# Patient Record
Sex: Female | Born: 1960
Health system: Southern US, Community
[De-identification: ages and names within clinical notes are randomized; demographics above are authoritative.]

---

## 2017-06-24 ENCOUNTER — Ambulatory Visit: Payer: Self-pay | Admitting: Primary Care

## 2017-07-15 ENCOUNTER — Ambulatory Visit (INDEPENDENT_AMBULATORY_CARE_PROVIDER_SITE_OTHER): Payer: BLUE CROSS/BLUE SHIELD | Admitting: Primary Care

## 2017-07-15 ENCOUNTER — Other Ambulatory Visit (HOSPITAL_COMMUNITY)
Admission: RE | Admit: 2017-07-15 | Discharge: 2017-07-15 | Disposition: A | Discharge status: Home / Self Care | Payer: BLUE CROSS/BLUE SHIELD | Source: Ambulatory Visit | Attending: Primary Care | Admitting: Primary Care

## 2017-07-15 ENCOUNTER — Encounter: Payer: Self-pay | Admitting: Primary Care

## 2017-07-15 VITALS — BP 128/80 | HR 102 | Temp 98.1°F | Ht 65.0 in | Wt 127.8 lb

## 2017-07-15 DIAGNOSIS — Z124 Encounter for screening for malignant neoplasm of cervix: Secondary | ICD-10-CM | POA: Insufficient documentation

## 2017-07-15 DIAGNOSIS — Z0000 Encounter for general adult medical examination without abnormal findings: Secondary | ICD-10-CM | POA: Diagnosis not present

## 2017-07-15 DIAGNOSIS — Z1211 Encounter for screening for malignant neoplasm of colon: Secondary | ICD-10-CM | POA: Diagnosis not present

## 2017-07-15 DIAGNOSIS — Z1239 Encounter for other screening for malignant neoplasm of breast: Secondary | ICD-10-CM

## 2017-07-15 DIAGNOSIS — Z23 Encounter for immunization: Secondary | ICD-10-CM

## 2017-07-15 DIAGNOSIS — Z1231 Encounter for screening mammogram for malignant neoplasm of breast: Secondary | ICD-10-CM

## 2017-07-15 DIAGNOSIS — Z0001 Encounter for general adult medical examination with abnormal findings: Secondary | ICD-10-CM | POA: Insufficient documentation

## 2017-07-15 LAB — LIPID PANEL
Cholesterol: 232 mg/dL — ABNORMAL HIGH (ref 0–200)
HDL: 111.1 mg/dL (ref >39.00)
LDL Cholesterol: 103 mg/dL — ABNORMAL HIGH (ref 0–99)
NonHDL: 120.54
Total CHOL/HDL Ratio: 2
Triglycerides: 89 mg/dL (ref 0.0–149.0)
VLDL: 17.8 mg/dL (ref 0.0–40.0)

## 2017-07-15 LAB — COMPREHENSIVE METABOLIC PANEL
ALT: 18 U/L (ref 0–35)
AST: 23 U/L (ref 0–37)
Albumin: 4.6 g/dL (ref 3.5–5.2)
Alkaline Phosphatase: 39 U/L (ref 39–117)
BUN: 17 mg/dL (ref 6–23)
CO2: 28 mEq/L (ref 19–32)
Calcium: 10 mg/dL (ref 8.4–10.5)
Chloride: 95 mEq/L — ABNORMAL LOW (ref 96–112)
Creatinine, Ser: 0.62 mg/dL (ref 0.40–1.20)
GFR: 105.53 mL/min (ref >60.00)
Glucose, Bld: 92 mg/dL (ref 70–99)
Potassium: 3.6 mEq/L (ref 3.5–5.1)
Sodium: 135 mEq/L (ref 135–145)
Total Bilirubin: 1.4 mg/dL — ABNORMAL HIGH (ref 0.2–1.2)
Total Protein: 7.7 g/dL (ref 6.0–8.3)

## 2017-07-15 NOTE — Addendum Note (Signed)
Addended by: Tawnya Crook on: 07/15/2017 10:57 AM   Modules accepted: Orders

## 2017-07-15 NOTE — Progress Notes (Signed)
Subjective:    Patient ID: Tracy Nash, female    DOB: Apr 17, 1961, 56 y.o.   MRN: [id number]  HPI  [name] is a 56 year old female who presents today to establish care and for complete physical. Her last physical was years ago.   Immunizations: -Tetanus: Completed over 10 years.  -Influenza: Due this season.    Diet: She endorses a poor diet.  Breakfast: Skips, sometimes fruit Lunch: Skips sometimes, sandwich Dinner: Fish, chicken, veggies Snacks: Crackers, fruit, chips Desserts: None Beverages: Coffee, sweet tea, water  Exercise: She works out at [employer] 4 days weekly Eye exam: Completed years ago. Due.  Dental exam: Dentures. Has an appointment in 2 weeks.  Colonoscopy: Never completed.  Pap Smear: Completed years ago.  Mammogram: Never completed.      Review of Systems  Constitutional: Negative for unexpected weight change.  HENT: Negative for rhinorrhea.   Respiratory: Negative for cough and shortness of breath.   Cardiovascular: Negative for chest pain.  Gastrointestinal: Negative for constipation and diarrhea.  Genitourinary: Negative for difficulty urinating and menstrual problem.  Musculoskeletal: Negative for arthralgias and myalgias.  Skin: Negative for rash.  Allergic/Immunologic: Negative for environmental allergies.  Neurological: Negative for dizziness, numbness and headaches.  Psychiatric/Behavioral:       Denies concerns for anxiety and depression       No past medical history on file.   Social History   Social History  . Marital status: Widowed    Spouse name: N/A  . Number of children: N/A  . Years of education: N/A   Occupational History  . Not on file.   Social History Main Topics  . Smoking status: Current Every Day Smoker    Packs/day: 0.50  . Smokeless tobacco: Never Used  . Alcohol use Yes  . Drug use: Unknown  . Sexual activity: Not on file   Other Topics Concern  . Not on file   Social History Narrative  . No  narrative on file    No past surgical history on file.  Family History  Problem Relation Age of Onset  . Cancer Mother   . Hypertension Mother     No Known Allergies  No current outpatient prescriptions on file prior to visit.   No current facility-administered medications on file prior to visit.     BP 128/80   Pulse (!) 102   Temp 98.1 F (36.7 C) (Oral)   Ht 5\' 5"  (1.651 m)   Wt 127 lb 12.8 oz (58 kg)   SpO2 98%   BMI 21.27 kg/m    Objective:   Physical Exam  Constitutional: She is oriented to person, place, and time. She appears well-nourished.  HENT:  Right Ear: Tympanic membrane and ear canal normal.  Left Ear: Tympanic membrane and ear canal normal.  Nose: Nose normal.  Mouth/Throat: Oropharynx is clear and moist.  Eyes: Pupils are equal, round, and reactive to light. Conjunctivae and EOM are normal.  Neck: Neck supple. No thyromegaly present.  Cardiovascular: Normal rate and regular rhythm.   No murmur heard. Pulmonary/Chest: Effort normal and breath sounds normal. She has no rales.  Abdominal: Soft. Bowel sounds are normal. There is no tenderness.  Musculoskeletal: Normal range of motion.  Lymphadenopathy:    She has no cervical adenopathy.  Neurological: She is alert and oriented to person, place, and time. She has normal reflexes. No cranial nerve deficit.  Skin: Skin is warm and dry. No rash noted.  Psychiatric: She  has a normal mood and affect.          Assessment & Plan:

## 2017-07-15 NOTE — Patient Instructions (Signed)
Complete lab work prior to leaving today. I will notify you of your results once received.   Call the [facility] to schedule your mammogram.  You will be contacted regarding your referral to GI for the colonoscopy.  Please let us know if you have not heard back within one week.   You were provided with a tetanus vaccination which will cover you for 10 years.   We will notify you once we receive your Pap results.  Follow up in 1 year for your annual exam or sooner if needed.  It was a pleasure to meet you today! Please don't hesitate to call me with any questions. Welcome to [facility]!

## 2017-07-15 NOTE — Assessment & Plan Note (Signed)
Td due, provided today. Pap smear due, completed today and is pending. Mammogram due, ordered. Colonoscopy due, referral placed. Recommended to increase vegetables, fruit, whole grains. Commended her on regular exercise. Exam unremarkable. Labs pending. Follow up in 1 year for annual exam.

## 2017-07-16 ENCOUNTER — Encounter: Payer: Self-pay | Admitting: *Deleted

## 2017-07-17 ENCOUNTER — Encounter: Payer: Self-pay | Admitting: *Deleted

## 2017-07-17 LAB — CYTOLOGY - PAP
Diagnosis: NEGATIVE
HPV: NOT DETECTED

## 2017-07-29 ENCOUNTER — Ambulatory Visit
Admission: RE | Admit: 2017-07-29 | Discharge: 2017-07-29 | Disposition: A | Discharge status: Home / Self Care | Payer: BLUE CROSS/BLUE SHIELD | Source: Ambulatory Visit | Attending: Primary Care | Admitting: Primary Care

## 2017-07-29 DIAGNOSIS — Z1239 Encounter for other screening for malignant neoplasm of breast: Secondary | ICD-10-CM

## 2017-07-29 DIAGNOSIS — Z1231 Encounter for screening mammogram for malignant neoplasm of breast: Secondary | ICD-10-CM | POA: Diagnosis not present

## 2017-07-31 ENCOUNTER — Ambulatory Visit (INDEPENDENT_AMBULATORY_CARE_PROVIDER_SITE_OTHER): Payer: BLUE CROSS/BLUE SHIELD | Admitting: Primary Care

## 2017-07-31 ENCOUNTER — Encounter: Payer: Self-pay | Admitting: Primary Care

## 2017-07-31 VITALS — BP 126/78 | HR 74 | Temp 98.4°F | Wt 129.8 lb

## 2017-07-31 DIAGNOSIS — Z0289 Encounter for other administrative examinations: Secondary | ICD-10-CM

## 2017-07-31 NOTE — Progress Notes (Signed)
   Subjective:    Patient ID: Tracy Nash, female    DOB: 06/16/1961, 56 y.o.   MRN: [id number]  HPI  Tracy Nash is a 56 year old female who presents today for form completion. She had DWI in [place] in 2016 after the death of her son. Her license was suspended at that time, she wasn't aware. She went to apply for her [place] drivers license and was told she needed medical evaluation and also needed a breath-a-lizer device placed in the vehicle. The [place] DMV is requiring form completion in order to reinstate her licence.   She denies any alcohol or substance abuse, chest pain, psychological issues, respiratory disorders, numbness/tingling/weakness, difficulty with sleep. She under went CPE on 07/15/17.  Review of Systems  Constitutional: Negative for unexpected weight change.  HENT: Negative for rhinorrhea.   Respiratory: Negative for cough and shortness of breath.   Cardiovascular: Negative for chest pain.  Gastrointestinal: Negative for constipation and diarrhea.  Genitourinary: Negative for difficulty urinating and menstrual problem.  Musculoskeletal: Negative for arthralgias and myalgias.  Skin: Negative for rash.  Allergic/Immunologic: Negative for environmental allergies.  Neurological: Negative for dizziness, numbness and headaches.  Psychiatric/Behavioral: Negative for sleep disturbance. The patient is not nervous/anxious.        No past medical history on file.   Social History   Social History  . Marital status: Widowed    Spouse name: N/A  . Number of children: N/A  . Years of education: N/A   Occupational History  . Not on file.   Social History Main Topics  . Smoking status: Current Every Day Smoker    Packs/day: 0.50  . Smokeless tobacco: Never Used  . Alcohol use Yes  . Drug use: Unknown  . Sexual activity: Not on file   Other Topics Concern  . Not on file   Social History Narrative  . No narrative on file    No past surgical history on  file.  Family History  Problem Relation Age of Onset  . Cancer Mother   . Hypertension Mother   . Ovarian cancer Mother [id number]  . Breast cancer Paternal Grandmother     No Known Allergies  No current outpatient prescriptions on file prior to visit.   No current facility-administered medications on file prior to visit.     BP 126/78   Pulse 74   Temp 98.4 F (36.9 C) (Oral)   Wt 129 lb 12.8 oz (58.9 kg)   SpO2 98%   BMI 21.60 kg/m    Objective:   Physical Exam  Constitutional: She appears well-nourished.  Cardiovascular: Normal rate and regular rhythm.   Pulmonary/Chest: Effort normal and breath sounds normal.  Skin: Skin is warm and dry.  Psychiatric: She has a normal mood and affect.          Assessment & Plan:  Form Completion:  Needs form completion from DMV to reinstate her drivers license. Form completed. I do not see any reason why she could not drive. CPE on 07/15/17 unremarkable. Full exam completed that date.  Tracy Nash,Tracy Leedom Kendal, NP

## 2017-07-31 NOTE — Patient Instructions (Signed)
Please notify me if you require any additional information.   It was a pleasure to see you today!

## 2017-08-01 ENCOUNTER — Telehealth: Payer: Self-pay

## 2017-08-01 ENCOUNTER — Other Ambulatory Visit: Payer: Self-pay

## 2017-08-01 DIAGNOSIS — Z1211 Encounter for screening for malignant neoplasm of colon: Secondary | ICD-10-CM

## 2017-08-01 DIAGNOSIS — Z1212 Encounter for screening for malignant neoplasm of rectum: Principal | ICD-10-CM

## 2017-08-01 NOTE — Telephone Encounter (Signed)
Gastroenterology Pre-Procedure Review  Request Date: 10/8  Requesting Physician: Dr. Tobi BastosAnna  PATIENT REVIEW QUESTIONS: The patient responded to the following health history questions as indicated:    *No major illnesses at this time  1. Are you having any GI issues? no 2. Do you have a personal history of Polyps? no 3. Do you have a family history of Colon Cancer or Polyps? no 4. Diabetes Mellitus? no 5. Joint replacements in the past 12 months?no 6. Major health problems in the past 3 months?no 7. Any artificial heart valves, MVP, or defibrillator?no    MEDICATIONS & ALLERGIES:    Patient reports the following regarding taking any anticoagulation/antiplatelet therapy:   Plavix, Coumadin, Eliquis, Xarelto, Lovenox, Pradaxa, Brilinta, or Effient? no Aspirin? no  Patient confirms/reports the following medications:  No current outpatient prescriptions on file.   No current facility-administered medications for this visit.     Patient confirms/reports the following allergies:  No Known Allergies  No orders of the defined types were placed in this encounter.   AUTHORIZATION INFORMATION Primary Insurance: 1D#: Group #:  Secondary Insurance: 1D#: Group #:  SCHEDULE INFORMATION: Date: 10/8 Time: Location: [facility]

## 2017-08-12 ENCOUNTER — Telehealth: Payer: Self-pay | Admitting: Gastroenterology

## 2017-08-12 NOTE — Telephone Encounter (Signed)
Patient Tracy Nash to call her so she can r/s her procedure on 08/26/17.

## 2017-08-13 ENCOUNTER — Telehealth: Payer: Self-pay

## 2017-08-13 NOTE — Telephone Encounter (Signed)
LVM for patient return call. Responded to message received.

## 2017-08-26 ENCOUNTER — Ambulatory Visit
Admission: RE | Admit: 2017-08-26 | Payer: BLUE CROSS/BLUE SHIELD | Source: Ambulatory Visit | Admitting: Gastroenterology

## 2017-08-26 ENCOUNTER — Encounter: Admission: RE | Payer: Self-pay | Source: Ambulatory Visit

## 2017-08-26 SURGERY — COLONOSCOPY WITH PROPOFOL
Anesthesia: General

## 2017-08-28 ENCOUNTER — Telehealth: Payer: Self-pay | Admitting: *Deleted

## 2017-08-28 NOTE — Telephone Encounter (Signed)
Patient dropped off at form that needs that was not entirely completely by the provider Mayra Reel. Patient states it is the last question on the second page. The form will be in the nurse RX tower. If you have any questions you can contact the patient . Her contact number Is [phone].

## 2017-08-28 NOTE — Telephone Encounter (Signed)
Noted, form completed and placed in Chan's inbox. 

## 2017-08-28 NOTE — Telephone Encounter (Signed)
Placed paperwork in Tracy Nash's inbox for review 

## 2017-08-28 NOTE — Telephone Encounter (Signed)
Per DPR, left detail message of Kate's comments for patient. 

## 2017-10-08 DIAGNOSIS — F438 Other reactions to severe stress: Secondary | ICD-10-CM | POA: Diagnosis not present

## 2018-04-23 ENCOUNTER — Ambulatory Visit (INDEPENDENT_AMBULATORY_CARE_PROVIDER_SITE_OTHER): Payer: BLUE CROSS/BLUE SHIELD | Admitting: Primary Care

## 2018-04-23 ENCOUNTER — Encounter: Payer: Self-pay | Admitting: Primary Care

## 2018-04-23 VITALS — BP 126/80 | HR 80 | Temp 97.9°F | Ht 65.0 in | Wt 127.5 lb

## 2018-04-23 DIAGNOSIS — H6121 Impacted cerumen, right ear: Secondary | ICD-10-CM | POA: Diagnosis not present

## 2018-04-23 NOTE — Progress Notes (Signed)
Subjective:    Patient ID: Tracy Nash, female    DOB: 03/10/1961, 57 y.o.   MRN: [id number]  HPI  Tracy Nash is a 57 year old female who presents today with a chief complaint of ear fullness.  Her fullness is located to the right ear that she first noticed 2 weeks ago. She wears a headset at work and has noticed that the sounds are more muffled. She also endorses a ringing in the right ear which is constant. She swims nearly everyday in the pool and sometimes in the ocean. She's been using peroxide and OTC ear drops without improvement.   She denies congestion, rhinorrhea, sinus pressure, cough. She uses q-tips regularly.  Review of Systems  Constitutional: Negative for fever.  HENT: Negative for congestion, ear pain, sinus pressure and sore throat.        Ear fullness  Respiratory: Negative for cough.        No past medical history on file.   Social History   Socioeconomic History  . Marital status: Widowed    Spouse name: Not on file  . Number of children: Not on file  . Years of education: Not on file  . Highest education level: Not on file  Occupational History  . Not on file  Social Needs  . Financial resource strain: Not on file  . Food insecurity:    Worry: Not on file    Inability: Not on file  . Transportation needs:    Medical: Not on file    Non-medical: Not on file  Tobacco Use  . Smoking status: Current Some Day Smoker    Packs/day: 0.50  . Smokeless tobacco: Never Used  Substance and Sexual Activity  . Alcohol use: Yes  . Drug use: Not on file  . Sexual activity: Not on file  Lifestyle  . Physical activity:    Days per week: Not on file    Minutes per session: Not on file  . Stress: Not on file  Relationships  . Social connections:    Talks on phone: Not on file    Gets together: Not on file    Attends religious service: Not on file    Active member of club or organization: Not on file    Attends meetings of clubs or organizations: Not  on file    Relationship status: Not on file  . Intimate partner violence:    Fear of current or ex partner: Not on file    Emotionally abused: Not on file    Physically abused: Not on file    Forced sexual activity: Not on file  Other Topics Concern  . Not on file  Social History Narrative  . Not on file    No past surgical history on file.  Family History  Problem Relation Age of Onset  . Cancer Mother   . Hypertension Mother   . Ovarian cancer Mother [id number]  . Breast cancer Paternal Grandmother     No Known Allergies  No current outpatient medications on file prior to visit.   No current facility-administered medications on file prior to visit.     BP 126/80   Pulse 80   Temp 97.9 F (36.6 C) (Oral)   Ht 5\' 5"  (1.651 m)   Wt 127 lb 8 oz (57.8 kg)   BMI 21.22 kg/m    Objective:   Physical Exam  Constitutional: She appears well-nourished.  HENT:  Left Ear: Tympanic membrane and ear canal normal.  Nose: No mucosal edema.  Mouth/Throat: Oropharynx is clear and moist.  Right canal with moderate cerumen impaction without visualization of TM. TM irrigated, post irrigation without infection. Mild irritation of skin of canal  Neck: Neck supple.  Cardiovascular: Normal rate.  Respiratory: Effort normal and breath sounds normal.  Skin: Skin is warm and dry.           Assessment & Plan:  Cerumen Impaction:  Ear fullness x 2 weeks, also with ringing and muffled sound. Exam today with moderate cerumen impaction to right canal, left canal and TM unremarkable.  Right TM and canal post irrigation unremarkable. Mild irritation to skin of canal.  Discussed use of Debrox drops, avoid q tip use.   Doreene Nest, NP

## 2018-04-23 NOTE — Patient Instructions (Signed)
Consider using Debrox drops if you start to notice ear fullness again.  Consider mixing 50/50 rubbing alcohol and peroxide and use a few drops after each swim.  Avoid use of q-tips.   It was a pleasure to see you today!

## 2018-08-12 ENCOUNTER — Encounter (INDEPENDENT_AMBULATORY_CARE_PROVIDER_SITE_OTHER): Payer: Self-pay

## 2018-08-12 ENCOUNTER — Encounter: Payer: Self-pay | Admitting: Primary Care

## 2018-08-12 ENCOUNTER — Ambulatory Visit (INDEPENDENT_AMBULATORY_CARE_PROVIDER_SITE_OTHER): Payer: BLUE CROSS/BLUE SHIELD | Admitting: Primary Care

## 2018-08-12 ENCOUNTER — Ambulatory Visit (INDEPENDENT_AMBULATORY_CARE_PROVIDER_SITE_OTHER)
Admission: RE | Admit: 2018-08-12 | Discharge: 2018-08-12 | Disposition: A | Discharge status: Home / Self Care | Payer: BLUE CROSS/BLUE SHIELD | Source: Ambulatory Visit | Attending: Primary Care | Admitting: Primary Care

## 2018-08-12 ENCOUNTER — Encounter

## 2018-08-12 VITALS — BP 122/78 | HR 87 | Temp 98.3°F | Ht 65.0 in | Wt 135.5 lb

## 2018-08-12 DIAGNOSIS — G8929 Other chronic pain: Secondary | ICD-10-CM

## 2018-08-12 DIAGNOSIS — M25562 Pain in left knee: Secondary | ICD-10-CM

## 2018-08-12 DIAGNOSIS — M1712 Unilateral primary osteoarthritis, left knee: Secondary | ICD-10-CM | POA: Diagnosis not present

## 2018-08-12 MED ORDER — NAPROXEN 500 MG PO TABS: 500.0000 mg | ORAL_TABLET | Freq: Two times a day (BID) | ORAL | 0 refills | Status: DC

## 2018-08-12 NOTE — Assessment & Plan Note (Signed)
Located to left knee, intermittent. Acute on chronic presentation today. Start with plain films today. Rx for naproxen course sent to pharmacy. Discussed ice/heat, avoid vigorous exercise. Consider sports medicine vs PT once plain films return.

## 2018-08-12 NOTE — Progress Notes (Signed)
Subjective:    Patient ID: Tracy Nash, female    DOB: 09/11/1961, 57 y.o.   MRN: [id number]  HPI  Tracy Nash is a 57 year old female with a history of chronic knee pain who presents today with a chief complaint of knee pain.   Her pain is is located the left knee at the medial side which began one week ago. Her symptoms began with left knee swelling which was also apparent to the calf, ankle and toes, this dissipated within 24 hours. The swelling to the left knee has improved but not resolved. Her pain remains the same.   She works out at [employer] 3-4 times weekly, no changes in her physical activity. One week ago she started wearing a knee brace during the day due to pain. Overall her pain is about the same.   She denies weakness, numbness/tinlging, feeling that the knee is giving out, recent trauma. She's taken Tylenol recently for her pain.   Review of Systems  Musculoskeletal: Positive for joint swelling.       Left knee pain  Skin: Negative for color change.  Neurological: Negative for weakness and numbness.       No past medical history on file.   Social History   Socioeconomic History  . Marital status: Widowed    Spouse name: Not on file  . Number of children: Not on file  . Years of education: Not on file  . Highest education level: Not on file  Occupational History  . Not on file  Social Needs  . Financial resource strain: Not on file  . Food insecurity:    Worry: Not on file    Inability: Not on file  . Transportation needs:    Medical: Not on file    Non-medical: Not on file  Tobacco Use  . Smoking status: Current Some Day Smoker    Packs/day: 0.50  . Smokeless tobacco: Never Used  Substance and Sexual Activity  . Alcohol use: Yes  . Drug use: Not on file  . Sexual activity: Not on file  Lifestyle  . Physical activity:    Days per week: Not on file    Minutes per session: Not on file  . Stress: Not on file  Relationships  . Social  connections:    Talks on phone: Not on file    Gets together: Not on file    Attends religious service: Not on file    Active member of club or organization: Not on file    Attends meetings of clubs or organizations: Not on file    Relationship status: Not on file  . Intimate partner violence:    Fear of current or ex partner: Not on file    Emotionally abused: Not on file    Physically abused: Not on file    Forced sexual activity: Not on file  Other Topics Concern  . Not on file  Social History Narrative  . Not on file    No past surgical history on file.  Family History  Problem Relation Age of Onset  . Cancer Mother   . Hypertension Mother   . Ovarian cancer Mother [id number]  . Breast cancer Paternal Grandmother     No Known Allergies  No current outpatient medications on file prior to visit.   No current facility-administered medications on file prior to visit.     BP 122/78   Pulse 87   Temp 98.3 F (36.8 C) (Oral)  Ht 5\' 5"  (1.651 m)   Wt 135 lb 8 oz (61.5 kg)   SpO2 97%   BMI 22.55 kg/m    Objective:   Physical Exam  Constitutional: She appears well-nourished.  Cardiovascular: Normal rate.  Respiratory: Effort normal.  Musculoskeletal:       Legs: 5/5 strength to bilateral lower extremities. Ambulatory in clinic without difficulty. Mild swelling to left medial knee.   Skin: Skin is warm and dry. No erythema.           Assessment & Plan:

## 2018-08-12 NOTE — Patient Instructions (Signed)
Complete xray(s) prior to leaving today. I will notify you of your results once received.  You may take the naproxen twice daily with food as needed for pain and inflammation.  Continue to wear the knee brace during the day.  Avoid intense exercise for the rest of this week.  It was a pleasure to see you today!

## 2018-08-18 ENCOUNTER — Ambulatory Visit: Payer: BLUE CROSS/BLUE SHIELD | Admitting: Primary Care

## 2018-08-18 ENCOUNTER — Telehealth: Payer: Self-pay | Admitting: Primary Care

## 2018-08-18 NOTE — Telephone Encounter (Signed)
Copied from CRM [id number]. Topic: General - Other >> Aug 18, 2018  2:45 PM Maia Petties wrote: Reason for CRM: Pt called stating she is taking naproxen (NAPROSYN) 500 MG tablet and wearing a knee brace. She states it is not feeling any different with the naproxen. She isn't sure next steps. She mentioned talk of sports med or PT at the last OV. Pt does exercise and work out and does want to be able to continue. Pt states ok to leave detailed msg. Please advise.

## 2018-08-18 NOTE — Telephone Encounter (Signed)
Please notify patient that I'd like for her to see Dr. Patsy Lager. Will you schedule her for a visit with him?

## 2018-08-25 ENCOUNTER — Ambulatory Visit (INDEPENDENT_AMBULATORY_CARE_PROVIDER_SITE_OTHER): Payer: BLUE CROSS/BLUE SHIELD | Admitting: Family Medicine

## 2018-08-25 ENCOUNTER — Encounter: Payer: Self-pay | Admitting: Family Medicine

## 2018-08-25 VITALS — BP 100/70 | HR 104 | Temp 98.5°F | Ht 65.0 in | Wt 133.8 lb

## 2018-08-25 DIAGNOSIS — M2392 Unspecified internal derangement of left knee: Secondary | ICD-10-CM

## 2018-08-25 DIAGNOSIS — M1712 Unilateral primary osteoarthritis, left knee: Secondary | ICD-10-CM | POA: Diagnosis not present

## 2018-08-25 MED ORDER — METHYLPREDNISOLONE ACETATE 40 MG/ML IJ SUSP
80.0000 mg | Freq: Once | INTRAMUSCULAR | Status: AC
  Administered 2018-08-25: 80 mg via INTRA_ARTICULAR

## 2018-08-25 NOTE — Progress Notes (Signed)
Dr. Karleen Hampshire T. Legacy Carrender, MD, CAQ [facility] [address] [place], [address] Phone: [phone] Fax: [phone]  08/25/2018  Patient: Tracy Nash, MRN: [id number], DOB: Apr 05, 1961, 57 y.o.  Primary Physician:  Doreene Nest, NP   Chief Complaint  Patient presents with  . Knee Pain    Left   Subjective:   Tracy Nash is a 57 y.o. very pleasant female patient who presents with the following:  Patient presents for L knee pain eval and OA:  No injury. Has been working out for a long time.  Sitting all day for work.  Works out at [employer].   Has been taking some naprosyn. No help.  Medial joint line pain. Will click and pop.  No locking up, no giving way.  She has tried tylenol and naproxen.   Past Medical History, Surgical History, Social History, Family History, Problem List, Medications, and Allergies have been reviewed and updated if relevant.  Patient Active Problem List   Diagnosis Date Noted  . Chronic pain of left knee 08/12/2018  . Preventative health care 07/15/2017    History reviewed. No pertinent past medical history.  History reviewed. No pertinent surgical history.  Social History   Socioeconomic History  . Marital status: Widowed    Spouse name: Not on file  . Number of children: Not on file  . Years of education: Not on file  . Highest education level: Not on file  Occupational History  . Not on file  Social Needs  . Financial resource strain: Not on file  . Food insecurity:    Worry: Not on file    Inability: Not on file  . Transportation needs:    Medical: Not on file    Non-medical: Not on file  Tobacco Use  . Smoking status: Current Some Day Smoker    Packs/day: 0.50  . Smokeless tobacco: Never Used  Substance and Sexual Activity  . Alcohol use: Yes  . Drug use: Not on file  . Sexual activity: Not on file  Lifestyle  . Physical activity:    Days per week: Not on file   Minutes per session: Not on file  . Stress: Not on file  Relationships  . Social connections:    Talks on phone: Not on file    Gets together: Not on file    Attends religious service: Not on file    Active member of club or organization: Not on file    Attends meetings of clubs or organizations: Not on file    Relationship status: Not on file  . Intimate partner violence:    Fear of current or ex partner: Not on file    Emotionally abused: Not on file    Physically abused: Not on file    Forced sexual activity: Not on file  Other Topics Concern  . Not on file  Social History Narrative  . Not on file    Family History  Problem Relation Age of Onset  . Cancer Mother   . Hypertension Mother   . Ovarian cancer Mother 72  . Breast cancer Paternal Grandmother     No Known Allergies  Medication list reviewed and updated in full in [facility].  GEN: No fevers, chills. Nontoxic. Primarily MSK c/o today. MSK: Detailed in the HPI GI: tolerating PO intake without difficulty Neuro: No numbness, parasthesias, or tingling associated. Otherwise the pertinent positives of the ROS are noted above.  Objective:   BP 100/70   Pulse (!) 104   Temp 98.5 F (36.9 C) (Oral)   Ht 5\' 5"  (1.651 m)   Wt 133 lb 12 oz (60.7 kg)   BMI 22.26 kg/m    GEN: WDWN, NAD, Non-toxic, Alert & Oriented x 3 HEENT: Atraumatic, Normocephalic.  Ears and Nose: No external deformity. EXTR: No clubbing/cyanosis/edema NEURO: Normal gait.  PSYCH: Normally interactive. Conversant. Not depressed or anxious appearing.  Calm demeanor.   Knee:  L Gait: Normal heel toe pattern ROM: 0-130 Effusion: minimal Echymosis or edema: none Patellar tendon NT Painful PLICA: neg Patellar grind: negative Medial and lateral patellar facet loading: negative medial and lateral joint lines:medial + Mcmurray's + medial pain Flexion-pinch + Varus and valgus stress: stable Lachman: neg Ant and Post drawer:  neg Hip abduction, IR, ER: WNL Hip flexion str: 5/5 Hip abd: 5/5 Quad: 5/5 VMO atrophy:No Hamstring concentric and eccentric: 5/5   Radiology: Dg Knee 4 Views W/patella Left  Result Date: 08/12/2018 CLINICAL DATA:  Left knee pain. EXAM: LEFT KNEE - COMPLETE 4+ VIEW COMPARISON:  None. FINDINGS: Bones: No fracture or dislocation. Normal bone mineralization. No periostitis. Joints: Normal alignment. No erosive changes. Mild medial and lateral femorotibial compartment joint space narrowing bilaterally. No significant patellofemoral compartment joint space narrowing. Moderate left knee joint effusion. Soft tissue: No soft tissue abnormality. No radiopaque foreign body. No chondrocalcinosis. IMPRESSION: 1. Mild osteoarthritis of the medial and lateral femorotibial compartments of the left knee. Electronically Signed   By: Elige Ko   On: 08/12/2018 16:40    Assessment and Plan:   Acute internal derangement of left knee  Osteoarthrosis, localized, primary, knee, left  OA changes mild on film.   History and exam with new onset clicking most likely degenerative medial meniscal tear.   Cont NSAIDS, ice after work and gym.  Goes to gym 4 days a week - given [facility] general knee protocol  Knee Injection, L Date of procedure: 08/25/2018 Patient verbally consented to procedure. Risks (including potential rare risk of infection), benefits, and alternatives explained. Sterilely prepped with Chloraprep. Ethyl cholride used for anesthesia. 8 cc Lidocaine 1% mixed with 2 mL Depo-Medrol 40 mg injected using the anteromedial approach without difficulty. No complications with procedure and tolerated well. Patient had decreased pain post-injection. Medication: 2 mL of Depo-Medrol 40 mg, equaling Depo-Medrol 80 mg total   Follow-up: around Thanksgiving if still having problems.  Signed,  Elpidio Galea. Aveleen Nevers, MD   Allergies as of 08/25/2018   No Known Allergies     Medication List        Accurate  as of 08/25/18  9:49 AM. Always use your most recent med list.          naproxen 500 MG tablet Commonly known as:  NAPROSYN Take 1 tablet (500 mg total) by mouth 2 (two) times daily with a meal.

## 2018-08-25 NOTE — Addendum Note (Signed)
Addended by: Damita Lack on: 08/25/2018 10:16 AM   Modules accepted: Orders

## 2019-01-28 DIAGNOSIS — H6121 Impacted cerumen, right ear: Secondary | ICD-10-CM | POA: Diagnosis not present

## 2019-05-18 DIAGNOSIS — S0101XA Laceration without foreign body of scalp, initial encounter: Secondary | ICD-10-CM | POA: Diagnosis not present

## 2019-05-18 DIAGNOSIS — R51 Headache: Secondary | ICD-10-CM | POA: Diagnosis not present

## 2019-05-29 DIAGNOSIS — L089 Local infection of the skin and subcutaneous tissue, unspecified: Secondary | ICD-10-CM | POA: Diagnosis not present

## 2019-05-29 DIAGNOSIS — T25222A Burn of second degree of left foot, initial encounter: Secondary | ICD-10-CM | POA: Diagnosis not present

## 2020-06-02 ENCOUNTER — Ambulatory Visit (INDEPENDENT_AMBULATORY_CARE_PROVIDER_SITE_OTHER)
Admission: RE | Admit: 2020-06-02 | Discharge: 2020-06-02 | Disposition: A | Discharge status: Home / Self Care | Payer: BC Managed Care – PPO | Source: Ambulatory Visit | Attending: Primary Care | Admitting: Primary Care

## 2020-06-02 ENCOUNTER — Other Ambulatory Visit: Payer: Self-pay

## 2020-06-02 ENCOUNTER — Encounter: Payer: Self-pay | Admitting: Primary Care

## 2020-06-02 ENCOUNTER — Ambulatory Visit (INDEPENDENT_AMBULATORY_CARE_PROVIDER_SITE_OTHER): Payer: BC Managed Care – PPO | Admitting: Primary Care

## 2020-06-02 VITALS — BP 108/70 | HR 90 | Temp 96.6°F | Ht 65.0 in | Wt 150.0 lb

## 2020-06-02 DIAGNOSIS — M1711 Unilateral primary osteoarthritis, right knee: Secondary | ICD-10-CM | POA: Diagnosis not present

## 2020-06-02 DIAGNOSIS — M25561 Pain in right knee: Secondary | ICD-10-CM

## 2020-06-02 DIAGNOSIS — G8929 Other chronic pain: Secondary | ICD-10-CM

## 2020-06-02 NOTE — Progress Notes (Signed)
Subjective:    Patient ID: Tracy Nash, female    DOB: 1961/09/29, 59 y.o.   MRN: [id number]  HPI  This visit occurred during the SARS-CoV-2 public health emergency.  Safety protocols were in place, including screening questions prior to the visit, additional usage of staff PPE, and extensive cleaning of exam room while observing appropriate contact time as indicated for disinfecting solutions.   Tracy Nash is a 59 year old female with a history of chronic left knee pain who presents today with a chief complaint of knee pain.   Her pain is located to right anterior knee bilaterally that began months ago, worse over the last several weeks. Also with intermittent swelling. She denies injury/trauma, numbness/tinlging. Pain is worse with walking up stairs. She has no pain riding bicycles. She takes Aleve on occasion with improvement.   She underwent plain films of the left knee in Fall 2019 which revealed mild osteoarthritis of the medial and lateral femorotibial compartments of the knee. She completed a steroid injection per Dr. Patsy Lager at the time, has had significant relief to the left knee.   BP Readings from Last 3 Encounters:  06/02/20 108/70  08/25/18 100/70  08/12/18 122/78     Review of Systems  Musculoskeletal: Positive for arthralgias and joint swelling.  Skin: Negative for color change.  Neurological: Negative for weakness and numbness.       No past medical history on file.   Social History   Socioeconomic History  . Marital status: Widowed    Spouse name: Not on file  . Number of children: Not on file  . Years of education: Not on file  . Highest education level: Not on file  Occupational History  . Not on file  Tobacco Use  . Smoking status: Current Some Day Smoker    Packs/day: 0.50  . Smokeless tobacco: Never Used  Substance and Sexual Activity  . Alcohol use: Yes  . Drug use: Not on file  . Sexual activity: Not on file  Other Topics Concern  .  Not on file  Social History Narrative  . Not on file   Social Determinants of Health   Financial Resource Strain:   . Difficulty of Paying Living Expenses:   Food Insecurity:   . Worried About Programme researcher, broadcasting/film/video in the Last Year:   . Barista in the Last Year:   Transportation Needs:   . Freight forwarder (Medical):   Marland Kitchen Lack of Transportation (Non-Medical):   Physical Activity:   . Days of Exercise per Week:   . Minutes of Exercise per Session:   Stress:   . Feeling of Stress :   Social Connections:   . Frequency of Communication with Friends and Family:   . Frequency of Social Gatherings with Friends and Family:   . Attends Religious Services:   . Active Member of Clubs or Organizations:   . Attends Banker Meetings:   Marland Kitchen Marital Status:   Intimate Partner Violence:   . Fear of Current or Ex-Partner:   . Emotionally Abused:   Marland Kitchen Physically Abused:   . Sexually Abused:     No past surgical history on file.  Family History  Problem Relation Age of Onset  . Cancer Mother   . Hypertension Mother   . Ovarian cancer Mother 57  . Breast cancer Paternal Grandmother     No Known Allergies  No current outpatient medications on file prior to visit.  No current facility-administered medications on file prior to visit.    BP 108/70   Pulse 90   Temp (!) 96.6 F (35.9 C) (Temporal)   Ht 5\' 5"  (1.651 m)   Wt 150 lb (68 kg)   SpO2 97%   BMI 24.96 kg/m    Objective:   Physical Exam Cardiovascular:     Rate and Rhythm: Normal rate and regular rhythm.  Pulmonary:     Effort: Pulmonary effort is normal.     Breath sounds: Normal breath sounds.  Musculoskeletal:     Right knee: Swelling present. No bony tenderness or crepitus. Normal range of motion. No tenderness. Normal alignment and normal patellar mobility.       Legs:     Comments: 5/5 strength to bilateral lower extremities   Skin:    General: Skin is warm and dry.     Findings: No  erythema.  Neurological:     Mental Status: She is alert.            Assessment & Plan:

## 2020-06-02 NOTE — Patient Instructions (Signed)
Take Aleve twice daily for the next 5-7 days for pain and swelling.  Complete xray(s) prior to leaving today. I will notify you of your results once received.  Schedule a visit with Dr. Patsy Lager for your knee.  Schedule a physical with me as discussed.  It was a pleasure to see you today!

## 2020-06-02 NOTE — Assessment & Plan Note (Signed)
Acute on chronic knee pain with swelling that is noted on exam today. No trauma. No alarm signs.  Suspect bursitis from arthritis, especially given presentation and history of left knee arthritis. Aleve helps, but she is not taking consistently.  Checking plain films of the knee today. Discussed to trial Aleve BID for 5-7 days. She will schedule with Dr. Patsy Lager for injection if needed.

## 2020-06-21 NOTE — Progress Notes (Signed)
Tracy Nash T. Tracy Hall, MD, [facility] [place] [facility] [place], [address]  Phone: [phone]   FAX: [phone]  Tracy Nash - 59 y.o. female   MRN [id number]   Date of Birth: Jul 27, 1961  Date: 06/22/2020   PCP: Pleas Koch, NP   Referral: Pleas Koch, NP  Chief Complaint  Patient presents with   Knee Pain    Right   Check Right Hand    This visit occurred during the SARS-CoV-2 public health emergency.  Safety protocols were in place, including screening questions prior to the visit, additional usage of staff PPE, and extensive cleaning of exam room while observing appropriate contact time as indicated for disinfecting solutions.   Subjective:   Tracy Nash is a 59 y.o. very pleasant female patient with Body mass index is 25.21 kg/m. who presents with the following:  She is she is a very pleasant lady, and I saw her about 2 years ago with some left-sided knee pain.  She does have bilateral known tricompartmental osteoarthritis that is mild.  2 years ago, did inject her knee with some steroids which is giving her long-lasting relief.  This was on the left knee.  My partner Mrs. Clark asked for me to assess her knee situation and make recommendations.  She does have some pain going up and down stairs, but this is not every day.  She does not have any trouble putting on clothes or getting in and out of a car or a seated position.  She is not having any locking up or symptomatic giving way.  He is not having any other mechanical symptoms.  She does have a BMI of 25.  She also has a right-sided fourth digit contracture while this is there, it is not causing her any functional impairment and does not hurt.  Started NSAIDS at home.  Helps only a little.  Stairs hurt a lot.  Review of Systems is noted in the HPI, as appropriate   Objective:   BP 130/80    Pulse 94     Temp 98.2 F (36.8 C) (Temporal)    Ht 5' 5"  (1.651 m)    Wt 151 lb 8 oz (68.7 kg)    SpO2 97%    BMI 25.21 kg/m    GEN: No acute distress; alert,appropriate. PULM: Breathing comfortably in no respiratory distress PSYCH: Normally interactive.    Right-sided fourth digit Dupuytren's contracture  Right knee: No effusion.  No pain at the patellar facets.  Stable to varus and valgus stress.  ACL and PCL are intact.  Bounce home, flexion pinch, as well as McMurray's are without any pain and no mechanical symptoms today.  Strength 5/5 and neurovascularly intact.  Radiology: DG Knee Complete 4 Views Right  Result Date: 06/02/2020 CLINICAL DATA:  Acute on chronic knee pain without known injury. EXAM: RIGHT KNEE - COMPLETE 4+ VIEW COMPARISON:  None. FINDINGS: No evidence of fracture, dislocation, or joint effusion. Mild narrowing of the medial and lateral joint spaces is noted. Soft tissues are unremarkable. IMPRESSION: Mild degenerative joint disease. No acute abnormality seen in the right knee. Electronically Signed   By: Marijo Conception M.D.   On: 06/02/2020 16:59    Assessment and Plan:     ICD-10-CM   1. Primary osteoarthritis of right knee  M17.11   2. Chronic pain of right knee  M25.561    G89.29  3. Dupuytren's contracture of right hand  M72.0    Osteoarthritis, right knee, with exacerbation, but not severe currently.  Start meloxicam, reviewed alternative medications and complementary treatments as well.  Maintain quad as well as hip strength and core strength.  Dupuytren's contracture, at this point it is not causing her any functional impairment and no pain.  I think that it is fine to follow this along and if it gets worse or more symptomatic then initially I would try an injection with some steroids, and otherwise I think it would be reasonable to involve him in surgery.  Patient Instructions  OSTEOARTHRITIS: Over the counter  Tylenol: 2 tablets up to 3-4 times a  day  Topical Capzaicin Cream, as needed (wear glove to put on) - THIS IS EXCEPTIONALLY HOT  Supplements: Tart cherry juice and Curcumin (Turmeric extract) have good scientific evidence  - get the concentrated capsules or gelcaps over the counter so you do not get the calories from the juice.  Weight loss will always take stress off of the joints and back  Volaren 1% gel. Over the counter You can apply up to 4 times a day Minimal is absorbed in the bloodstream Cost is about 9 dollars  Ice joints on bad days, 20 min, 2-3 x / day REGULAR EXERCISE: swimming, Yoga, Tai Chi, bicycle (NON-IMPACT activity)      Follow-up: No follow-ups on file.  Meds ordered this encounter  Medications   meloxicam (MOBIC) 15 MG tablet    Sig: Take 1 tablet (15 mg total) by mouth daily.    Dispense:  30 tablet    Refill:  2   There are no discontinued medications. No orders of the defined types were placed in this encounter.   Signed,  [name], MD   Outpatient Encounter Medications as of 06/22/2020  Medication Sig   meloxicam (MOBIC) 15 MG tablet Take 1 tablet (15 mg total) by mouth daily.   No facility-administered encounter medications on file as of 06/22/2020.

## 2020-06-22 ENCOUNTER — Ambulatory Visit (INDEPENDENT_AMBULATORY_CARE_PROVIDER_SITE_OTHER): Payer: BC Managed Care – PPO | Admitting: Family Medicine

## 2020-06-22 ENCOUNTER — Encounter: Payer: Self-pay | Admitting: Family Medicine

## 2020-06-22 ENCOUNTER — Other Ambulatory Visit: Payer: Self-pay

## 2020-06-22 VITALS — BP 130/80 | HR 94 | Temp 98.2°F | Ht 65.0 in | Wt 151.5 lb

## 2020-06-22 DIAGNOSIS — M1711 Unilateral primary osteoarthritis, right knee: Secondary | ICD-10-CM | POA: Diagnosis not present

## 2020-06-22 DIAGNOSIS — G8929 Other chronic pain: Secondary | ICD-10-CM | POA: Diagnosis not present

## 2020-06-22 DIAGNOSIS — M25561 Pain in right knee: Secondary | ICD-10-CM

## 2020-06-22 DIAGNOSIS — M720 Palmar fascial fibromatosis [Dupuytren]: Secondary | ICD-10-CM | POA: Diagnosis not present

## 2020-06-22 MED ORDER — MELOXICAM 15 MG PO TABS: 15.0000 mg | ORAL_TABLET | Freq: Every day | ORAL | 2 refills | Status: AC

## 2020-06-22 NOTE — Patient Instructions (Signed)
OSTEOARTHRITIS: Over the counter  Tylenol: 2 tablets up to 3-4 times a day  Topical Capzaicin Cream, as needed (wear glove to put on) - THIS IS EXCEPTIONALLY HOT  Supplements: Tart cherry juice and Curcumin (Turmeric extract) have good scientific evidence  - get the concentrated capsules or gelcaps over the counter so you do not get the calories from the juice.  Weight loss will always take stress off of the joints and back  Volaren 1% gel. Over the counter You can apply up to 4 times a day Minimal is absorbed in the bloodstream Cost is about 9 dollars  Ice joints on bad days, 20 min, 2-3 x / day REGULAR EXERCISE: swimming, Yoga, Tai Chi, bicycle (NON-IMPACT activity)

## 2020-07-14 ENCOUNTER — Encounter: Payer: BC Managed Care – PPO | Admitting: Primary Care

## 2020-07-29 ENCOUNTER — Encounter: Payer: Self-pay | Admitting: Primary Care

## 2020-07-29 ENCOUNTER — Ambulatory Visit (INDEPENDENT_AMBULATORY_CARE_PROVIDER_SITE_OTHER): Payer: BC Managed Care – PPO | Admitting: Primary Care

## 2020-07-29 ENCOUNTER — Other Ambulatory Visit: Payer: Self-pay

## 2020-07-29 ENCOUNTER — Other Ambulatory Visit (HOSPITAL_COMMUNITY)
Admission: RE | Admit: 2020-07-29 | Discharge: 2020-07-29 | Disposition: A | Discharge status: Home / Self Care | Payer: BC Managed Care – PPO | Source: Ambulatory Visit | Attending: Primary Care | Admitting: Primary Care

## 2020-07-29 VITALS — BP 117/79 | HR 105 | Ht 64.5 in | Wt 148.0 lb

## 2020-07-29 DIAGNOSIS — Z1211 Encounter for screening for malignant neoplasm of colon: Secondary | ICD-10-CM

## 2020-07-29 DIAGNOSIS — Z0000 Encounter for general adult medical examination without abnormal findings: Secondary | ICD-10-CM | POA: Diagnosis not present

## 2020-07-29 DIAGNOSIS — G8929 Other chronic pain: Secondary | ICD-10-CM

## 2020-07-29 DIAGNOSIS — E785 Hyperlipidemia, unspecified: Secondary | ICD-10-CM | POA: Diagnosis not present

## 2020-07-29 DIAGNOSIS — M25561 Pain in right knee: Secondary | ICD-10-CM

## 2020-07-29 DIAGNOSIS — Z124 Encounter for screening for malignant neoplasm of cervix: Secondary | ICD-10-CM

## 2020-07-29 DIAGNOSIS — Z1231 Encounter for screening mammogram for malignant neoplasm of breast: Secondary | ICD-10-CM

## 2020-07-29 DIAGNOSIS — M25562 Pain in left knee: Secondary | ICD-10-CM

## 2020-07-29 DIAGNOSIS — Z23 Encounter for immunization: Secondary | ICD-10-CM

## 2020-07-29 LAB — COMPREHENSIVE METABOLIC PANEL
ALT: 34 U/L (ref 0–35)
AST: 26 U/L (ref 0–37)
Albumin: 4.8 g/dL (ref 3.5–5.2)
Alkaline Phosphatase: 35 U/L — ABNORMAL LOW (ref 39–117)
BUN: 17 mg/dL (ref 6–23)
CO2: 28 mEq/L (ref 19–32)
Calcium: 10.5 mg/dL (ref 8.4–10.5)
Chloride: 100 mEq/L (ref 96–112)
Creatinine, Ser: 0.74 mg/dL (ref 0.40–1.20)
GFR: 80.1 mL/min (ref >60.00)
Glucose, Bld: 87 mg/dL (ref 70–99)
Potassium: 4.8 mEq/L (ref 3.5–5.1)
Sodium: 135 mEq/L (ref 135–145)
Total Bilirubin: 0.8 mg/dL (ref 0.2–1.2)
Total Protein: 7.9 g/dL (ref 6.0–8.3)

## 2020-07-29 LAB — LIPID PANEL
Cholesterol: 226 mg/dL — ABNORMAL HIGH (ref 0–200)
HDL: 88.9 mg/dL (ref >39.00)
LDL Cholesterol: 126 mg/dL — ABNORMAL HIGH (ref 0–99)
NonHDL: 137.44
Total CHOL/HDL Ratio: 3
Triglycerides: 56 mg/dL (ref 0.0–149.0)
VLDL: 11.2 mg/dL (ref 0.0–40.0)

## 2020-07-29 LAB — CBC
HCT: 44.7 % (ref 36.0–46.0)
Hemoglobin: 15.2 g/dL — ABNORMAL HIGH (ref 12.0–15.0)
MCHC: 34 g/dL (ref 30.0–36.0)
MCV: 99.5 fl (ref 78.0–100.0)
Platelets: 235 10*3/uL (ref 150.0–400.0)
RBC: 4.5 Mil/uL (ref 3.87–5.11)
RDW: 13.9 % (ref 11.5–15.5)
WBC: 5.5 10*3/uL (ref 4.0–10.5)

## 2020-07-29 NOTE — Patient Instructions (Signed)
Stop by the lab prior to leaving today. I will notify you of your results once received.   You will be contacted regarding your referral to GI for the colonoscopy.  Please let us know if you have not been contacted within two weeks.   Call the [facility] to schedule your mammogram.   Start exercising. You should be getting 150 minutes of moderate intensity exercise weekly.  It's important to improve your diet by reducing consumption of fast food, fried food, processed snack foods, sugary drinks. Increase consumption of fresh vegetables and fruits, whole grains, water.  Ensure you are drinking 64 ounces of water daily.  It was a pleasure to see you today!   Preventive Care 40-18 Years Old, Female Preventive care refers to visits with your health care provider and lifestyle choices that can promote health and wellness. This includes:  A yearly physical exam. This may also be called an annual well check.  Regular dental visits and eye exams.  Immunizations.  Screening for certain conditions.  Healthy lifestyle choices, such as eating a healthy diet, getting regular exercise, not using drugs or products that contain nicotine and tobacco, and limiting alcohol use. What can I expect for my preventive care visit? Physical exam Your health care provider will check your:  Height and weight. This may be used to calculate body mass index (BMI), which tells if you are at a healthy weight.  Heart rate and blood pressure.  Skin for abnormal spots. Counseling Your health care provider may ask you questions about your:  Alcohol, tobacco, and drug use.  Emotional well-being.  Home and relationship well-being.  Sexual activity.  Eating habits.  Work and work Statistician.  Method of birth control.  Menstrual cycle.  Pregnancy history. What immunizations do I need?  Influenza (flu) vaccine  This is recommended every year. Tetanus, diphtheria, and pertussis (Tdap)  vaccine  You may need a Td booster every 10 years. Varicella (chickenpox) vaccine  You may need this if you have not been vaccinated. Zoster (shingles) vaccine  You may need this after age 9. Measles, mumps, and rubella (MMR) vaccine  You may need at least one dose of MMR if you were born in 1957 or later. You may also need a second dose. Pneumococcal conjugate (PCV13) vaccine  You may need this if you have certain conditions and were not previously vaccinated. Pneumococcal polysaccharide (PPSV23) vaccine  You may need one or two doses if you smoke cigarettes or if you have certain conditions. Meningococcal conjugate (MenACWY) vaccine  You may need this if you have certain conditions. Hepatitis A vaccine  You may need this if you have certain conditions or if you travel or work in places where you may be exposed to hepatitis A. Hepatitis B vaccine  You may need this if you have certain conditions or if you travel or work in places where you may be exposed to hepatitis B. Haemophilus influenzae type b (Hib) vaccine  You may need this if you have certain conditions. Human papillomavirus (HPV) vaccine  If recommended by your health care provider, you may need three doses over 6 months. You may receive vaccines as individual doses or as more than one vaccine together in one shot (combination vaccines). Talk with your health care provider about the risks and benefits of combination vaccines. What tests do I need? Blood tests  Lipid and cholesterol levels. These may be checked every 5 years, or more frequently if you are over 50 years  old.  Hepatitis C test.  Hepatitis B test. Screening  Lung cancer screening. You may have this screening every year starting at age 47 if you have a 30-pack-year history of smoking and currently smoke or have quit within the past 15 years.  Colorectal cancer screening. All adults should have this screening starting at age 67 and continuing until  age 35. Your health care provider may recommend screening at age 15 if you are at increased risk. You will have tests every 1-10 years, depending on your results and the type of screening test.  Diabetes screening. This is done by checking your blood sugar (glucose) after you have not eaten for a while (fasting). You may have this done every 1-3 years.  Mammogram. This may be done every 1-2 years. Talk with your health care provider about when you should start having regular mammograms. This may depend on whether you have a family history of breast cancer.  BRCA-related cancer screening. This may be done if you have a family history of breast, ovarian, tubal, or peritoneal cancers.  Pelvic exam and Pap test. This may be done every 3 years starting at age 89. Starting at age 75, this may be done every 5 years if you have a Pap test in combination with an HPV test. Other tests  Sexually transmitted disease (STD) testing.  Bone density scan. This is done to screen for osteoporosis. You may have this scan if you are at high risk for osteoporosis. Follow these instructions at home: Eating and drinking  Eat a diet that includes fresh fruits and vegetables, whole grains, lean protein, and low-fat dairy.  Take vitamin and mineral supplements as recommended by your health care provider.  Do not drink alcohol if: ? Your health care provider tells you not to drink. ? You are pregnant, may be pregnant, or are planning to become pregnant.  If you drink alcohol: ? Limit how much you have to 0-1 drink a day. ? Be aware of how much alcohol is in your drink. In the [place] one drink equals one 12 oz bottle of beer (355 mL), one 5 oz glass of wine (148 mL), or one 1 oz glass of hard liquor (44 mL). Lifestyle  Take daily care of your teeth and gums.  Stay active. Exercise for at least 30 minutes on 5 or more days each week.  Do not use any products that contain nicotine or tobacco, such as cigarettes,  e-cigarettes, and chewing tobacco. If you need help quitting, ask your health care provider.  If you are sexually active, practice safe sex. Use a condom or other form of birth control (contraception) in order to prevent pregnancy and STIs (sexually transmitted infections).  If told by your health care provider, take low-dose aspirin daily starting at age 52. What's next?  Visit your health care provider once a year for a well check visit.  Ask your health care provider how often you should have your eyes and teeth checked.  Stay up to date on all vaccines. This information is not intended to replace advice given to you by your health care provider. Make sure you discuss any questions you have with your health care provider. Document Revised: 07/17/2018 Document Reviewed: 07/17/2018 Elsevier Patient Education  2020 [employer].

## 2020-07-29 NOTE — Assessment & Plan Note (Signed)
Influenza and pneumonia vaccines due, provided today.  Pap smear due, completed today. Mammogram due, orders placed. Colonoscopy due, referral placed to GI.  Encouraged a healthy diet, regular exercise. Exam today benign. Labs pending.

## 2020-07-29 NOTE — Progress Notes (Signed)
Subjective:    Patient ID: Tracy Nash, female    DOB: 07-19-1961, 59 y.o.   MRN: [id number]  HPI  This visit occurred during the SARS-CoV-2 public health emergency.  Safety protocols were in place, including screening questions prior to the visit, additional usage of staff PPE, and extensive cleaning of exam room while observing appropriate contact time as indicated for disinfecting solutions.   Tracy Nash is a 59 year old female who presents today for complete physical.  Immunizations: -Tetanus: Completed in 2018 -Influenza: Due today -Shingles: Never completed  -Pneumonia: Never completed  -Covid-19: Completed series  Diet: She endorses a healthy diet.  Exercise: She is exercising at home and walking 3-4 days weekly.   Eye exam: Scheduled for next week Dental exam: No recent exam   Pap Smear: Completed in 2018 Mammogram: Due Colonoscopy: Never completed. History of colon cancer in her mother who passed away at 35.  Hep C Screen: Lung Cancer Screening: She is smoking less than 1 pack per week for the last 20 years.   BP Readings from Last 3 Encounters:  07/29/20 117/79  06/22/20 130/80  06/02/20 108/70     Review of Systems  Constitutional: Negative for unexpected weight change.  HENT: Negative for rhinorrhea.   Respiratory: Negative for cough and shortness of breath.   Cardiovascular: Negative for chest pain.  Gastrointestinal: Negative for constipation and diarrhea.  Genitourinary: Negative for difficulty urinating.  Musculoskeletal: Positive for arthralgias.  Skin: Negative for rash.  Allergic/Immunologic: Negative for environmental allergies.  Neurological: Negative for dizziness, numbness and headaches.  Psychiatric/Behavioral: The patient is not nervous/anxious.        No past medical history on file.   Social History   Socioeconomic History  . Marital status: Widowed    Spouse name: Not on file  . Number of children: Not on file  . Years of  education: Not on file  . Highest education level: Not on file  Occupational History  . Not on file  Tobacco Use  . Smoking status: Current Some Day Smoker    Packs/day: 0.50  . Smokeless tobacco: Never Used  Substance and Sexual Activity  . Alcohol use: Yes  . Drug use: Not on file  . Sexual activity: Not on file  Other Topics Concern  . Not on file  Social History Narrative  . Not on file   Social Determinants of Health   Financial Resource Strain:   . Difficulty of Paying Living Expenses: Not on file  Food Insecurity:   . Worried About Programme researcher, broadcasting/film/video in the Last Year: Not on file  . Ran Out of Food in the Last Year: Not on file  Transportation Needs:   . Lack of Transportation (Medical): Not on file  . Lack of Transportation (Non-Medical): Not on file  Physical Activity:   . Days of Exercise per Week: Not on file  . Minutes of Exercise per Session: Not on file  Stress:   . Feeling of Stress : Not on file  Social Connections:   . Frequency of Communication with Friends and Family: Not on file  . Frequency of Social Gatherings with Friends and Family: Not on file  . Attends Religious Services: Not on file  . Active Member of Clubs or Organizations: Not on file  . Attends Banker Meetings: Not on file  . Marital Status: Not on file  Intimate Partner Violence:   . Fear of Current or Ex-Partner: Not on file  .  Emotionally Abused: Not on file  . Physically Abused: Not on file  . Sexually Abused: Not on file    No past surgical history on file.  Family History  Problem Relation Age of Onset  . Hypertension Mother   . Ovarian cancer Mother 52  . Breast cancer Paternal Grandmother     No Known Allergies  Current Outpatient Medications on File Prior to Visit  Medication Sig Dispense Refill  . meloxicam (MOBIC) 15 MG tablet Take 1 tablet (15 mg total) by mouth daily. 30 tablet 2   No current facility-administered medications on file prior to  visit.    BP 117/79   Pulse (!) 105   Ht 5' 4.5" (1.638 m)   Wt 148 lb (67.1 kg)   SpO2 97%   BMI 25.01 kg/m    Objective:   Physical Exam HENT:     Right Ear: Tympanic membrane and ear canal normal.     Left Ear: Tympanic membrane and ear canal normal.  Eyes:     Pupils: Pupils are equal, round, and reactive to light.  Cardiovascular:     Rate and Rhythm: Normal rate and regular rhythm.  Pulmonary:     Effort: Pulmonary effort is normal.     Breath sounds: Normal breath sounds.  Abdominal:     General: Bowel sounds are normal.     Palpations: Abdomen is soft.     Tenderness: There is no abdominal tenderness.  Genitourinary:    Labia:        Right: No tenderness or lesion.        Left: No tenderness or lesion.      Vagina: No vaginal discharge, erythema or tenderness.     Cervix: No cervical motion tenderness or discharge.     Uterus: Normal.      Adnexa: Right adnexa normal.  Musculoskeletal:        General: Normal range of motion.     Cervical back: Neck supple.  Skin:    General: Skin is warm and dry.  Neurological:     Mental Status: She is alert and oriented to person, place, and time.     Cranial Nerves: No cranial nerve deficit.     Deep Tendon Reflexes:     Reflex Scores:      Patellar reflexes are 2+ on the right side and 2+ on the left side. Psychiatric:        Mood and Affect: Mood normal.            Assessment & Plan:

## 2020-07-29 NOTE — Assessment & Plan Note (Signed)
Following with Dr. Copland, doing okay on Meloxicam. Continue same.  

## 2020-07-29 NOTE — Addendum Note (Signed)
Addended by: Judd Gaudier on: 07/29/2020 12:04 PM   Modules accepted: Orders

## 2020-07-29 NOTE — Assessment & Plan Note (Signed)
Following with Dr. Patsy Lager, doing okay on Meloxicam. Continue same.

## 2020-08-01 LAB — CYTOLOGY - PAP
Comment: NEGATIVE
Diagnosis: NEGATIVE
High risk HPV: NEGATIVE

## 2020-08-02 ENCOUNTER — Telehealth: Payer: Self-pay | Admitting: Primary Care

## 2020-08-02 NOTE — Telephone Encounter (Signed)
Patient returned call. Please call patient back about her lab results.

## 2020-08-03 NOTE — Telephone Encounter (Signed)
Have you seen this ppw?  

## 2020-08-03 NOTE — Telephone Encounter (Signed)
Noted  

## 2020-08-03 NOTE — Telephone Encounter (Signed)
See open lab note for information  

## 2020-08-09 ENCOUNTER — Encounter: Payer: Self-pay | Admitting: *Deleted

## 2022-01-20 IMAGING — DX DG KNEE COMPLETE 4+V*R*
4 series · 4 of 4 positions shown · non-contrast
Comparison: None.

CLINICAL DATA: Acute on chronic knee pain without known injury.

EXAM:
RIGHT KNEE - COMPLETE 4+ VIEW

[knee ap]
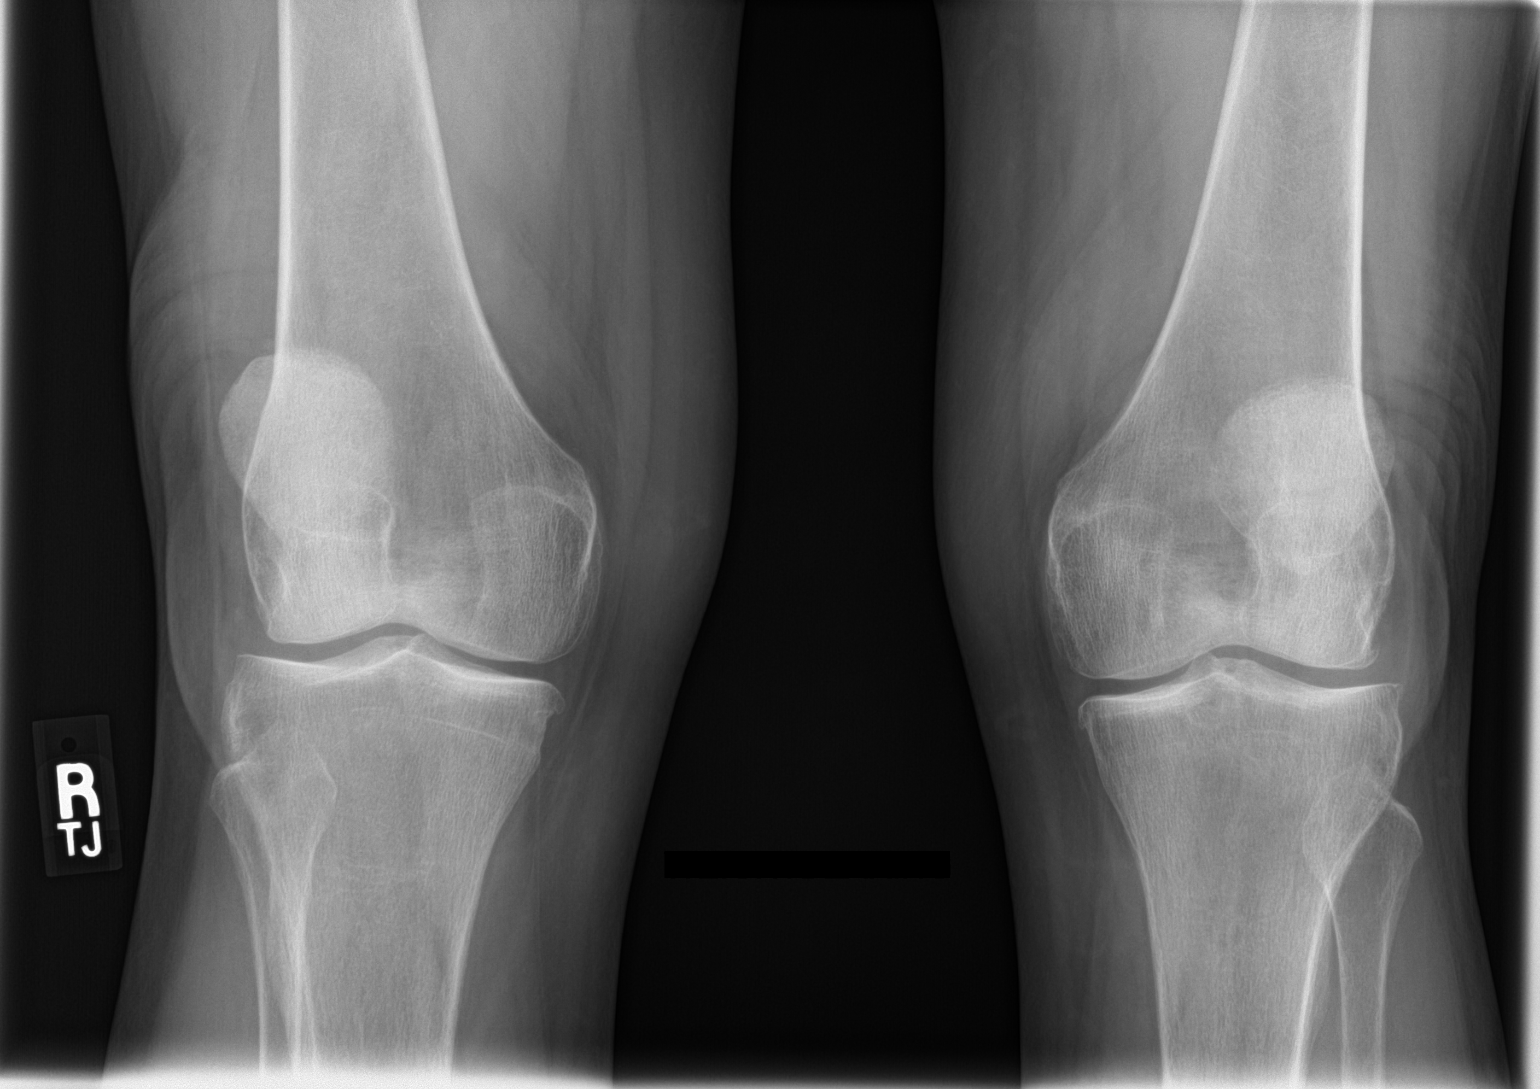

[knee tunnel]
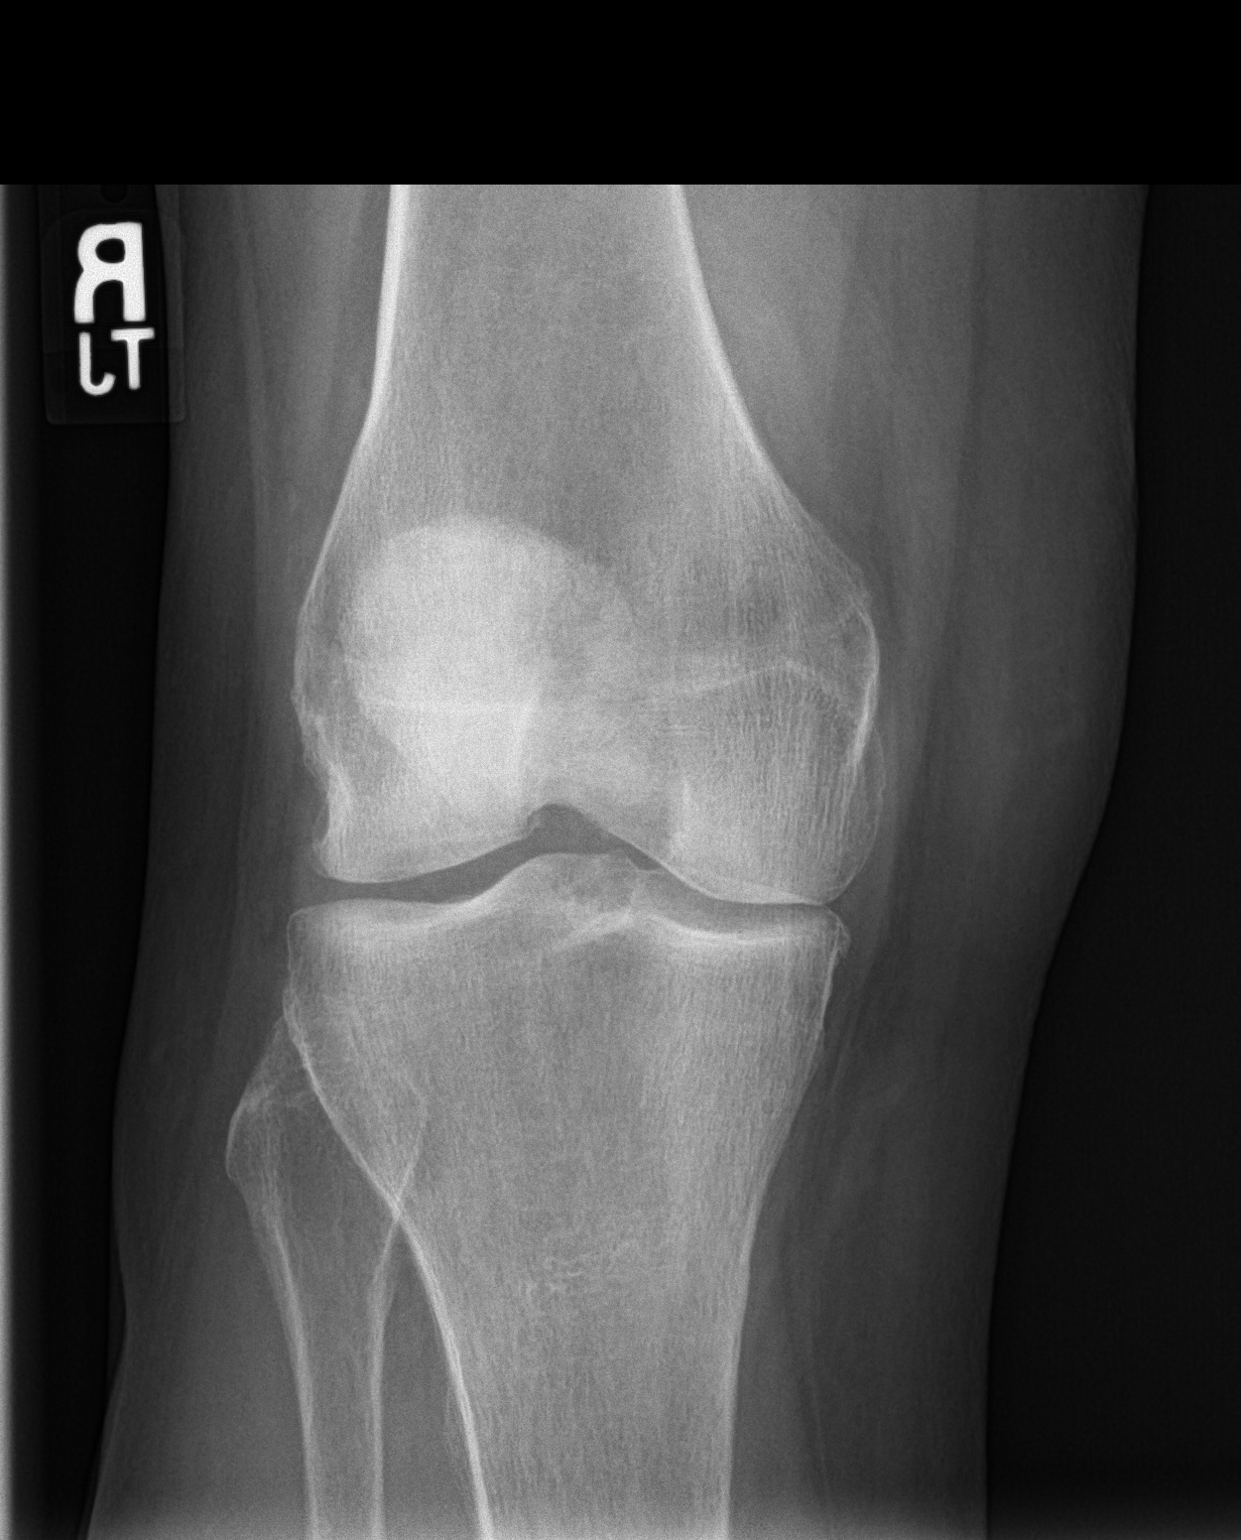

[knee lat]
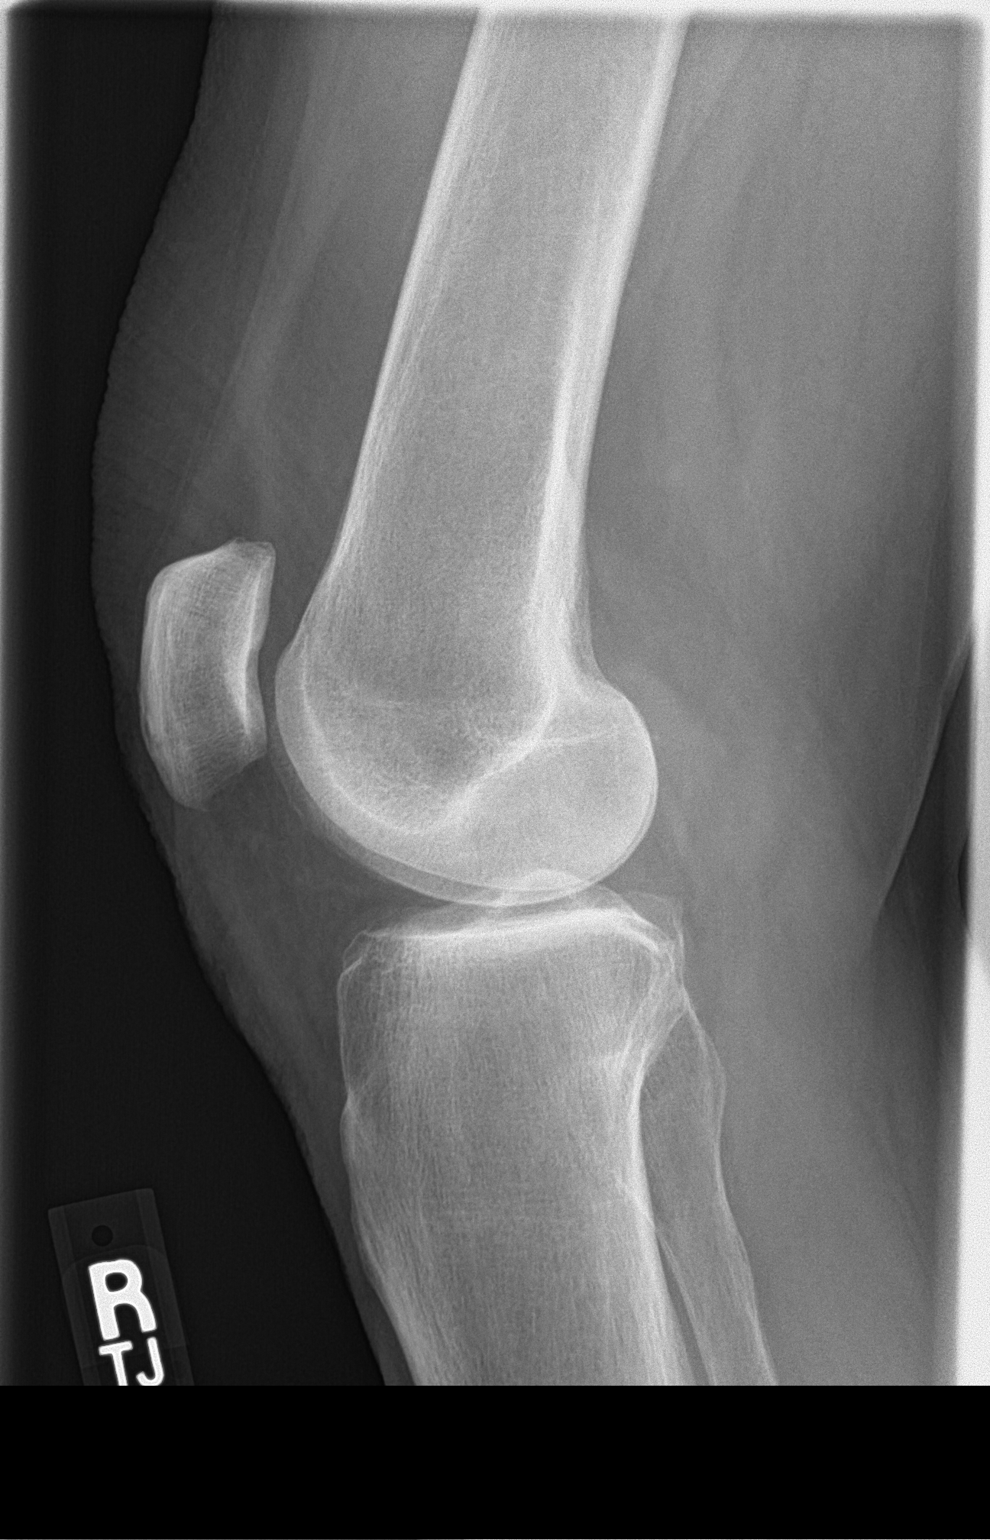

[patella skyline]
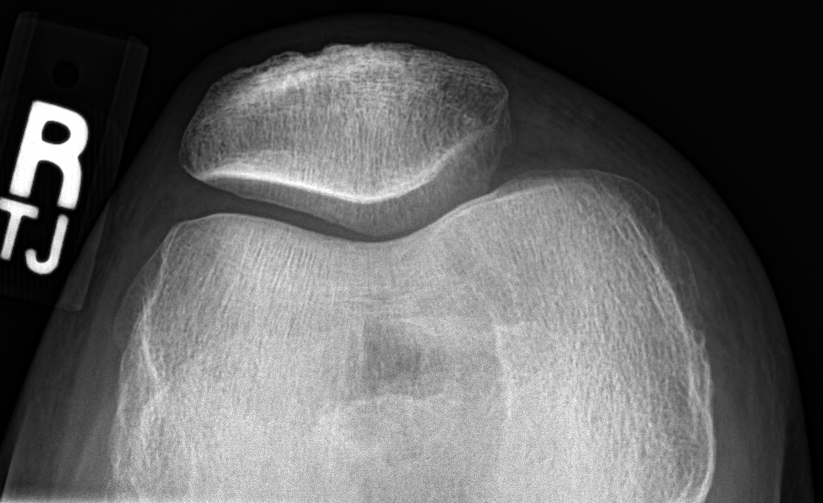

[4 of 4 positions shown; findings below may reference images not displayed]

FINDINGS: No evidence of fracture, dislocation, or joint effusion. Mild
narrowing of the medial and lateral joint spaces is noted. Soft
tissues are unremarkable.
IMPRESSION: Mild degenerative joint disease. No acute abnormality seen in the
right knee.

## 2022-07-16 ENCOUNTER — Ambulatory Visit (INDEPENDENT_AMBULATORY_CARE_PROVIDER_SITE_OTHER): Payer: BC Managed Care – PPO | Admitting: Primary Care

## 2022-07-16 ENCOUNTER — Encounter: Payer: Self-pay | Admitting: Primary Care

## 2022-07-16 DIAGNOSIS — H6120 Impacted cerumen, unspecified ear: Secondary | ICD-10-CM | POA: Insufficient documentation

## 2022-07-16 DIAGNOSIS — H6123 Impacted cerumen, bilateral: Secondary | ICD-10-CM

## 2022-07-16 NOTE — Patient Instructions (Signed)
It was a pleasure to see you today!  Earwax Buildup, Adult The ears produce a substance called earwax that helps keep bacteria out of the ear and protects the skin in the ear canal. Occasionally, earwax can build up in the ear and cause discomfort or hearing loss. What are the causes? This condition is caused by a buildup of earwax. Ear canals are self-cleaning. Ear wax is made in the outer part of the ear canal and generally falls out in small amounts over time. When the self-cleaning mechanism is not working, earwax builds up and can cause decreased hearing and discomfort. Attempting to clean ears with cotton swabs can push the earwax deep into the ear canal and cause decreased hearing and pain. What increases the risk? This condition is more likely to develop in people who: Clean their ears often with cotton swabs. Pick at their ears. Use earplugs or in-ear headphones often, or wear hearing aids. The following factors may also make you more likely to develop this condition: Being female. Being of older age. Naturally producing more earwax. Having narrow ear canals. Having earwax that is overly thick or sticky. Having excess hair in the ear canal. Having eczema. Being dehydrated. What are the signs or symptoms? Symptoms of this condition include: Reduced or muffled hearing. A feeling of fullness in the ear or feeling that the ear is plugged. Fluid coming from the ear. Ear pain or an itchy ear. Ringing in the ear. Coughing. Balance problems. An obvious piece of earwax that can be seen inside the ear canal. How is this diagnosed? This condition may be diagnosed based on: Your symptoms. Your medical history. An ear exam. During the exam, your health care provider will look into your ear with an instrument called an otoscope. You may have tests, including a hearing test. How is this treated? This condition may be treated by: Using ear drops to soften the earwax. Having the earwax  removed by a health care provider. The health care provider may: Flush the ear with water. Use an instrument that has a loop on the end (curette). Use a suction device. Having surgery to remove the wax buildup. This may be done in severe cases. Follow these instructions at home:  Take over-the-counter and prescription medicines only as told by your health care provider. Do not put any objects, including cotton swabs, into your ear. You can clean the opening of your ear canal with a washcloth or facial tissue. Follow instructions from your health care provider about cleaning your ears. Do not overclean your ears. Drink enough fluid to keep your urine pale yellow. This will help to thin the earwax. Keep all follow-up visits as told. If earwax builds up in your ears often or if you use hearing aids, consider seeing your health care provider for routine, preventive ear cleanings. Ask your health care provider how often you should schedule your cleanings. If you have hearing aids, clean them according to instructions from the manufacturer and your health care provider. Contact a health care provider if: You have ear pain. You develop a fever. You have pus or other fluid coming from your ear. You have hearing loss. You have ringing in your ears that does not go away. You feel like the room is spinning (vertigo). Your symptoms do not improve with treatment. Get help right away if: You have bleeding from the affected ear. You have severe ear pain. Summary Earwax can build up in the ear and cause discomfort or hearing  loss. The most common symptoms of this condition include reduced or muffled hearing, a feeling of fullness in the ear, or feeling that the ear is plugged. This condition may be diagnosed based on your symptoms, your medical history, and an ear exam. This condition may be treated by using ear drops to soften the earwax or by having the earwax removed by a health care provider. Do not  put any objects, including cotton swabs, into your ear. You can clean the opening of your ear canal with a washcloth or facial tissue. This information is not intended to replace advice given to you by your health care provider. Make sure you discuss any questions you have with your health care provider. Document Revised: 02/23/2020 Document Reviewed: 02/23/2020 Elsevier Patient Education  2023 [employer].

## 2022-07-16 NOTE — Assessment & Plan Note (Addendum)
Bilateral cerumen impaction identified on exam. Patient consented to irrigation of canals bilaterally. Debrox drops inserted to both canals to soften cerumen. Bilateral canals irrigated. Patient tolerated well. TM's without infection or complication post irrigation. Canals with mild redness/irritation. No bleeding.   Discussed home care instructions.

## 2022-07-16 NOTE — Progress Notes (Signed)
Subjective:    Patient ID: Tracy Nash, female    DOB: 04-26-1961, 61 y.o.   MRN: [id number]  Ear Fullness  Pertinent negatives include no headaches.    Tracy Nash is a very pleasant 61 y.o. female with a history of cerumen impaction who presents today to discuss ear fullness.  Her fullness is located to the bilateral ears, left worse than right, which she first noticed about one week ago. She has a history of cerumen impaction, this feels similar. She's not using q-tips any longer. She's been using Debrox drops OTC for a few days without improvement.   She denies pain, dizziness, headaches. She has noticed some ringing in the left ear which began about the same time.   Review of Systems  HENT:  Negative for congestion and ear pain.        Ear fullness  Neurological:  Negative for dizziness and headaches.         History reviewed. No pertinent past medical history.  Social History   Socioeconomic History   Marital status: Widowed    Spouse name: Not on file   Number of children: Not on file   Years of education: Not on file   Highest education level: Not on file  Occupational History   Not on file  Tobacco Use   Smoking status: Some Days    Packs/day: 0.50    Types: Cigarettes   Smokeless tobacco: Never  Substance and Sexual Activity   Alcohol use: Yes   Drug use: Not on file   Sexual activity: Not on file  Other Topics Concern   Not on file  Social History Narrative   Not on file   Social Determinants of Health   Financial Resource Strain: Not on file  Food Insecurity: Not on file  Transportation Needs: Not on file  Physical Activity: Not on file  Stress: Not on file  Social Connections: Not on file  Intimate Partner Violence: Not on file    History reviewed. No pertinent surgical history.  Family History  Problem Relation Age of Onset   Hypertension Mother    Ovarian cancer Mother 73   Breast cancer Paternal Grandmother     No Known  Allergies  No current outpatient medications on file prior to visit.   No current facility-administered medications on file prior to visit.    BP 118/74   Pulse (!) 105   Temp 98.6 F (37 C) (Oral)   Ht 5' 4.5" (1.638 m)   Wt 143 lb (64.9 kg)   SpO2 96%   BMI 24.17 kg/m  Objective:   Physical Exam HENT:     Right Ear: There is impacted cerumen.     Left Ear: There is impacted cerumen.  Cardiovascular:     Rate and Rhythm: Normal rate and regular rhythm.  Pulmonary:     Effort: Pulmonary effort is normal.     Breath sounds: Normal breath sounds.  Neurological:     Mental Status: She is alert.           Assessment & Plan:   Problem List Items Addressed This Visit       Nervous and Auditory   Cerumen impaction    Bilateral cerumen impaction identified on exam. Patient consented to irrigation of canals bilaterally. Debrox drops inserted to both canals to soften cerumen. Bilateral canals irrigated. Patient tolerated well. TM's without infection or complication post irrigation. Canals with mild redness/irritation. No bleeding.   Discussed home care  instructions.           Doreene Nest, NP

## 2022-08-16 ENCOUNTER — Encounter: Payer: Self-pay | Admitting: Primary Care

## 2023-06-11 NOTE — Progress Notes (Signed)
Tracy Bankson T. Davaris Youtsey, MD, CAQ Sports Medicine [facility] [address] [place], [address]  Phone: [phone]  FAX: [phone]  Tracy Nash - 62 y.o. female  MRN [id number]  Date of Birth: [birth year]  Date: 06/13/2023  PCP: Tracy Nest, NP  Referral: Tracy Nest, NP  Chief Complaint  Patient presents with   Knee Pain    Left injection   Hand Pain    Right Ring Finger   Subjective:   Tracy Nash is a 62 y.o. very pleasant female patient with Body mass index is 25.94 kg/m. who presents with the following:  Patient is here for follow-up of bilateral chronic knee pain with known evidence of radiographically proven at least mild tricompartmental osteoarthritis.  She presents today for possible left-sided knee intra-articular injection.  The last time I injected her knee which was several years ago, she had multiple years of relief of symptoms.  She essentially has no other significant medical problems.  She does take some over-the-counter analgesics and NSAIDs occasionally.  L knee having a lot of pain, stairs are hurting a lot.   Working at [employer] - on 5 days, 12 hour shifts -At baseline, she typically works 40 hours a week, right now she is working 60+ hours a week.  She also has a significant Dupuytren's contracture on the right fourth digit.  It does not really cause her any pain but she is starting to get a contracture.  Review of Systems is noted in the HPI, as appropriate  Objective:   BP 120/80 (BP Location: Left Arm, Patient Position: Sitting, Cuff Size: Normal)   Pulse 88   Temp 97.9 F (36.6 C) (Temporal)   Ht 5' 4.5" (1.638 m)   Wt 153 lb 8 oz (69.6 kg)   SpO2 98%   BMI 25.94 kg/m   GEN: No acute distress; alert,appropriate. PULM: Breathing comfortably in no respiratory distress PSYCH: Normally interactive.   Left knee: Full extension.  Flexion 120.  Mild pain with manipulation of the patella  with loading the patellar facets. She does have medial greater than lateral joint line tenderness Stable to varus and valgus stress ACL and PCL are intact Flexion pinch and McMurray's cause pain, but there are no mechanical symptoms. Bounce home test is negative  At the right fourth digit on the hand, patient does have a significant Dupuytren's contracture and she is starting to get a flexion deformity.  Laboratory and Imaging Data:  Assessment and Plan:     ICD-10-CM   1. Primary osteoarthritis of left knee  M17.12 triamcinolone acetonide (KENALOG-40) injection 40 mg    2. Dupuytren's contracture of right hand  M72.0 Ambulatory referral to Hand Surgery     Acute on chronic left-sided knee osteoarthritis with exacerbation.  She can continue with conservative care for arthritis, and we are going to do an intra-articular injection today.  She has a significant Dupuytren's contracture on the right fourth digit.  This is starting to cause her some functional impairment.  I am going to have the patient see hand surgery for potential definitive management or possible Xiaflex injection.  I appreciate their help.   Aspiration/Injection Procedure Note [name] Nov 29, 1960 Date of procedure: 06/13/2023  Procedure: Large Joint Aspiration / Injection of Knee, L Indications: Pain  Procedure Details Patient verbally consented to procedure. Risks, benefits, and alternatives explained. Sterilely prepped with Chloraprep. Ethyl cholride used for anesthesia. 9 cc Lidocaine 1% mixed with 1 mL  of Kenalog 40 mg injected using the anteromedial approach without difficulty. No complications with procedure and tolerated well. Patient had decreased pain post-injection. Medication: 1 mL of Kenalog 40 mg   Medication Management during today's office visit: Meds ordered this encounter  Medications   triamcinolone acetonide (KENALOG-40) injection 40 mg   There are no discontinued medications.  Orders  placed today for conditions managed today: Orders Placed This Encounter  Procedures   Ambulatory referral to Hand Surgery    Disposition: prn  [employer] One speech-to-text software was used for transcription in this dictation.  Possible transcriptional errors can occur using Animal nutritionist.   Signed,  Elpidio Galea. Jasara Corrigan, MD   Outpatient Encounter Medications as of 06/13/2023  Medication Sig   [EXPIRED] triamcinolone acetonide (KENALOG-40) injection 40 mg    No facility-administered encounter medications on file as of 06/13/2023.

## 2023-06-13 ENCOUNTER — Ambulatory Visit: Payer: BC Managed Care – PPO | Admitting: Family Medicine

## 2023-06-13 ENCOUNTER — Encounter: Payer: Self-pay | Admitting: Family Medicine

## 2023-06-13 VITALS — BP 120/80 | HR 88 | Temp 97.9°F | Ht 64.5 in | Wt 153.5 lb

## 2023-06-13 DIAGNOSIS — M1712 Unilateral primary osteoarthritis, left knee: Secondary | ICD-10-CM | POA: Diagnosis not present

## 2023-06-13 DIAGNOSIS — M720 Palmar fascial fibromatosis [Dupuytren]: Secondary | ICD-10-CM | POA: Diagnosis not present

## 2023-06-13 MED ORDER — TRIAMCINOLONE ACETONIDE 40 MG/ML IJ SUSP
40.0000 mg | Freq: Once | INTRAMUSCULAR | Status: AC
  Administered 2023-06-13: 40 mg via INTRA_ARTICULAR

## 2023-07-09 DIAGNOSIS — M720 Palmar fascial fibromatosis [Dupuytren]: Secondary | ICD-10-CM | POA: Diagnosis not present

## 2023-07-30 DIAGNOSIS — M720 Palmar fascial fibromatosis [Dupuytren]: Secondary | ICD-10-CM | POA: Diagnosis not present

## 2023-08-02 DIAGNOSIS — M720 Palmar fascial fibromatosis [Dupuytren]: Secondary | ICD-10-CM | POA: Diagnosis not present

## 2023-08-19 DIAGNOSIS — M720 Palmar fascial fibromatosis [Dupuytren]: Secondary | ICD-10-CM | POA: Diagnosis not present

## 2023-10-02 ENCOUNTER — Other Ambulatory Visit (HOSPITAL_COMMUNITY)
Admission: RE | Admit: 2023-10-02 | Discharge: 2023-10-02 | Disposition: A | Discharge status: Home / Self Care | Payer: BC Managed Care – PPO | Source: Ambulatory Visit | Attending: Primary Care | Admitting: Primary Care

## 2023-10-02 ENCOUNTER — Encounter: Payer: Self-pay | Admitting: Primary Care

## 2023-10-02 ENCOUNTER — Ambulatory Visit: Payer: BC Managed Care – PPO | Admitting: Primary Care

## 2023-10-02 VITALS — BP 136/82 | HR 100 | Temp 97.3°F | Ht 64.5 in | Wt 155.0 lb

## 2023-10-02 DIAGNOSIS — Z1211 Encounter for screening for malignant neoplasm of colon: Secondary | ICD-10-CM

## 2023-10-02 DIAGNOSIS — Z124 Encounter for screening for malignant neoplasm of cervix: Secondary | ICD-10-CM | POA: Diagnosis not present

## 2023-10-02 DIAGNOSIS — Z1231 Encounter for screening mammogram for malignant neoplasm of breast: Secondary | ICD-10-CM

## 2023-10-02 DIAGNOSIS — Z0001 Encounter for general adult medical examination with abnormal findings: Secondary | ICD-10-CM | POA: Diagnosis not present

## 2023-10-02 DIAGNOSIS — E785 Hyperlipidemia, unspecified: Secondary | ICD-10-CM | POA: Diagnosis not present

## 2023-10-02 DIAGNOSIS — Z23 Encounter for immunization: Secondary | ICD-10-CM | POA: Diagnosis not present

## 2023-10-02 DIAGNOSIS — B351 Tinea unguium: Secondary | ICD-10-CM | POA: Diagnosis not present

## 2023-10-02 LAB — CBC
HCT: 41.4 % (ref 36.0–46.0)
Hemoglobin: 14 g/dL (ref 12.0–15.0)
MCHC: 33.9 g/dL (ref 30.0–36.0)
MCV: 100.8 fL — ABNORMAL HIGH (ref 78.0–100.0)
Platelets: 349 10*3/uL (ref 150.0–400.0)
RBC: 4.11 Mil/uL (ref 3.87–5.11)
RDW: 13.7 % (ref 11.5–15.5)
WBC: 5.9 10*3/uL (ref 4.0–10.5)

## 2023-10-02 LAB — COMPREHENSIVE METABOLIC PANEL
ALT: 11 U/L (ref 0–35)
AST: 25 U/L (ref 0–37)
Albumin: 4.3 g/dL (ref 3.5–5.2)
Alkaline Phosphatase: 46 U/L (ref 39–117)
BUN: 11 mg/dL (ref 6–23)
CO2: 29 meq/L (ref 19–32)
Calcium: 9.9 mg/dL (ref 8.4–10.5)
Chloride: 99 meq/L (ref 96–112)
Creatinine, Ser: 0.57 mg/dL (ref 0.40–1.20)
GFR: 96.99 mL/min (ref >60.00)
Glucose, Bld: 85 mg/dL (ref 70–99)
Potassium: 3.5 meq/L (ref 3.5–5.1)
Sodium: 138 meq/L (ref 135–145)
Total Bilirubin: 0.8 mg/dL (ref 0.2–1.2)
Total Protein: 7.4 g/dL (ref 6.0–8.3)

## 2023-10-02 LAB — LIPID PANEL
Cholesterol: 212 mg/dL — ABNORMAL HIGH (ref 0–200)
HDL: 89.1 mg/dL (ref >39.00)
LDL Cholesterol: 99 mg/dL (ref 0–99)
NonHDL: 122.77
Total CHOL/HDL Ratio: 2
Triglycerides: 120 mg/dL (ref 0.0–149.0)
VLDL: 24 mg/dL (ref 0.0–40.0)

## 2023-10-02 LAB — HEMOGLOBIN A1C: Hgb A1c MFr Bld: 5.3 % (ref 4.6–6.5)

## 2023-10-02 NOTE — Patient Instructions (Signed)
Stop by the lab prior to leaving today. I will notify you of your results once received.   You will either be contacted via phone regarding your referral to GI and Podiatry, or you may receive a letter on your MyChart portal from our referral team with instructions for scheduling an appointment. Please let us know if you have not been contacted by anyone within two weeks.  [facility] to schedule your mammogram.   It was a pleasure to see you today!

## 2023-10-02 NOTE — Assessment & Plan Note (Signed)
Repeat lipid panel pending.  Discussed the importance of a healthy diet and regular exercise in order for weight loss, and to reduce the risk of further co-morbidity.  

## 2023-10-02 NOTE — Assessment & Plan Note (Signed)
Evident bilaterally to all toenails. Referral placed to podiatry for further evaluation.

## 2023-10-02 NOTE — Assessment & Plan Note (Signed)
First Shingrix vaccine and influenza vaccine provided today.  Discussed nurse visit for second Shingrix vaccine. Pap smear due, competed today Mammogram overdue, orders placed Colonoscopy overdue, referral placed to GI  Discussed the importance of a healthy diet and regular exercise in order for weight loss, and to reduce the risk of further co-morbidity.  Exam stable. Labs pending.  Follow up in 1 year for repeat physical.

## 2023-10-02 NOTE — Progress Notes (Signed)
Subjective:    Patient ID: Tracy Nash, female    DOB: [birth year], 62 y.o.   MRN: [id number]  HPI  Tracy Nash is a very pleasant 62 y.o. female who presents today for complete physical and follow up of chronic conditions.  She would also like to discuss thickened and dark toenails. Chronic for 6 months, located to all toenails, left foot worse than right. Progressively worsened. She works at [employer], is on her feet 10 hours per day, wears steel toed boots. She's tried multiple things OTC without improvement. She would like to see podiatry.  Immunizations: -Tetanus: Completed in 2018 -Influenza: Influenza vaccine provided today.  -Shingles: Never completed -Pneumonia: Completed in 2021  Diet: Fair diet.  Exercise: No regular exercise. Active  Eye exam: Completes annually  Dental exam: Completed years ago   Pap Smear: Completed in September 2021, due Mammogram: Completed last in 2018  Colonoscopy: Never completed.  Referral placed to GI in 2021, she never followed through. Lung Cancer Screening: Smoker socially since the age of 36, smoked 4 cigarettes at a time.   BP Readings from Last 3 Encounters:  10/02/23 136/82  06/13/23 120/80  07/16/22 118/74       Review of Systems  Constitutional:  Negative for unexpected weight change.  HENT:  Negative for rhinorrhea.   Respiratory:  Negative for cough and shortness of breath.   Cardiovascular:  Negative for chest pain.  Gastrointestinal:  Negative for constipation and diarrhea.  Genitourinary:  Negative for difficulty urinating.  Musculoskeletal:  Negative for arthralgias and myalgias.  Skin:  Negative for rash.       Thickened, dark yellow/brown toenails  Allergic/Immunologic: Negative for environmental allergies.  Neurological:  Negative for dizziness, numbness and headaches.  Psychiatric/Behavioral:  The patient is not nervous/anxious.          History reviewed. No pertinent past medical history.  Social  History   Socioeconomic History   Marital status: Widowed    Spouse name: Not on file   Number of children: Not on file   Years of education: Not on file   Highest education level: Not on file  Occupational History   Not on file  Tobacco Use   Smoking status: Some Days    Current packs/day: 0.50    Types: Cigarettes   Smokeless tobacco: Never  Substance and Sexual Activity   Alcohol use: Yes   Drug use: Not on file   Sexual activity: Not on file  Other Topics Concern   Not on file  Social History Narrative   Not on file   Social Determinants of Health   Financial Resource Strain: Not on file  Food Insecurity: Not on file  Transportation Needs: Not on file  Physical Activity: Not on file  Stress: Not on file  Social Connections: Not on file  Intimate Partner Violence: Not on file    History reviewed. No pertinent surgical history.  Family History  Problem Relation Age of Onset   Hypertension Mother    Ovarian cancer Mother 64   Breast cancer Paternal Grandmother     No Known Allergies  No current outpatient medications on file prior to visit.   No current facility-administered medications on file prior to visit.    BP 136/82   Pulse 100   Temp (!) 97.3 F (36.3 C) (Temporal)   Ht 5' 4.5" (1.638 m)   Wt 155 lb (70.3 kg)   SpO2 99%   BMI 26.19 kg/m  Objective:  Physical Exam Exam conducted with a chaperone present.  HENT:     Right Ear: Tympanic membrane and ear canal normal.     Left Ear: Tympanic membrane and ear canal normal.  Eyes:     Pupils: Pupils are equal, round, and reactive to light.  Cardiovascular:     Rate and Rhythm: Normal rate and regular rhythm.  Pulmonary:     Effort: Pulmonary effort is normal.     Breath sounds: Normal breath sounds.  Abdominal:     General: Bowel sounds are normal.     Palpations: Abdomen is soft.     Tenderness: There is no abdominal tenderness.  Genitourinary:    Labia:        Right: No rash,  tenderness or lesion.        Left: No rash, tenderness or lesion.      Vagina: Normal.     Cervix: Normal.     Uterus: Normal.      Adnexa: Right adnexa normal and left adnexa normal.  Musculoskeletal:        General: Normal range of motion.     Cervical back: Neck supple.  Skin:    General: Skin is warm and dry.  Neurological:     Mental Status: She is alert and oriented to person, place, and time.     Cranial Nerves: No cranial nerve deficit.     Deep Tendon Reflexes:     Reflex Scores:      Patellar reflexes are 2+ on the right side and 2+ on the left side. Psychiatric:        Mood and Affect: Mood normal.           Assessment & Plan:  Encounter for annual general medical examination with abnormal findings in adult Assessment & Plan: First Shingrix vaccine and influenza vaccine provided today.  Discussed nurse visit for second Shingrix vaccine. Pap smear due, competed today Mammogram overdue, orders placed Colonoscopy overdue, referral placed to GI  Discussed the importance of a healthy diet and regular exercise in order for weight loss, and to reduce the risk of further co-morbidity.  Exam stable. Labs pending.  Follow up in 1 year for repeat physical.    Hyperlipidemia, unspecified hyperlipidemia type Assessment & Plan: Repeat lipid panel pending.  Discussed the importance of a healthy diet and regular exercise in order for weight loss, and to reduce the risk of further co-morbidity.   Orders: -     Lipid panel -     Hemoglobin A1c -     Comprehensive metabolic panel -     CBC  Onychomycosis Assessment & Plan: Evident bilaterally to all toenails. Referral placed to podiatry for further evaluation.  Orders: -     Ambulatory referral to Podiatry  Screening mammogram for breast cancer -     3D Screening Mammogram, Left and Right; Future  Screening for colon cancer -     Ambulatory referral to Gastroenterology  Screening for cervical cancer -      Cytology - PAP        Doreene Nest, NP

## 2023-10-03 ENCOUNTER — Telehealth: Payer: Self-pay | Admitting: Primary Care

## 2023-10-03 LAB — CYTOLOGY - PAP
Comment: NEGATIVE
Diagnosis: NEGATIVE
High risk HPV: NEGATIVE

## 2023-10-03 NOTE — Telephone Encounter (Signed)
Patient returned call regarding lab results.I relayed the good news to her and she verbalized understanding.

## 2023-10-03 NOTE — Telephone Encounter (Signed)
Added to results notes no further action needed.

## 2023-10-07 ENCOUNTER — Telehealth: Payer: Self-pay

## 2023-10-07 NOTE — Telephone Encounter (Signed)
Patient again called and is wanting to schedule her colonoscopy. Informed her the schedulers would give her a call back as soon as they can

## 2023-10-07 NOTE — Telephone Encounter (Signed)
Patient left a voicemail on my phone and states this is the third time she had call and she is calling to schedule her colonoscopy. She states to just to call her back when we can. She does work crazy hours.

## 2023-10-07 NOTE — Telephone Encounter (Signed)
Pt called back returning Kelli's missed call. Told pt Clark's response. Pt had no questions/concerns. Call back # [phone]

## 2023-10-07 NOTE — Telephone Encounter (Signed)
Message left for patient to return my call and gave my number in the voicemail

## 2023-10-08 NOTE — Telephone Encounter (Signed)
Message left for patient to return my call and gave my number in the voicemail

## 2023-10-11 ENCOUNTER — Encounter: Payer: Self-pay | Admitting: *Deleted

## 2023-10-24 ENCOUNTER — Ambulatory Visit: Payer: BC Managed Care – PPO | Admitting: Podiatry

## 2023-10-28 ENCOUNTER — Ambulatory Visit: Payer: BC Managed Care – PPO

## 2023-11-07 ENCOUNTER — Ambulatory Visit: Payer: BC Managed Care – PPO | Admitting: Podiatry

## 2023-11-07 ENCOUNTER — Encounter: Payer: Self-pay | Admitting: Podiatry

## 2023-11-07 VITALS — Ht 64.5 in | Wt 155.0 lb

## 2023-11-07 DIAGNOSIS — Z79899 Other long term (current) drug therapy: Secondary | ICD-10-CM | POA: Diagnosis not present

## 2023-11-07 DIAGNOSIS — B351 Tinea unguium: Secondary | ICD-10-CM

## 2023-11-07 NOTE — Progress Notes (Signed)
  Subjective:  Patient ID: Tracy Nash, female    DOB: 01-01-1961,  MRN: [id number]  Chief Complaint  Patient presents with   Nail Problem    Pt is here due to bilateral greater toe nail discoloration pt states the discoloration has been there for about 6 months, state she works at [employer] hour shifts and is constantly on her feet, and has to wear steel toe boots believes that may have something to do with it.    62 y.o. female presents with the above complaint.  Patient presents with bile lateral thickened elongated dystrophic mycotic toenails has been going for 6 months.  She states that has been causing her some issues.  She has tried topical medication and which has helped she would like to decrease dose oral medication.   Review of Systems: Negative except as noted in the HPI. Denies N/V/F/Ch.  No past medical history on file. No current outpatient medications on file.  Social History   Tobacco Use  Smoking Status Some Days   Current packs/day: 0.50   Types: Cigarettes  Smokeless Tobacco Never    No Known Allergies Objective:  There were no vitals filed for this visit. Body mass index is 26.19 kg/m. Constitutional Well developed. Well nourished.  Vascular Dorsalis pedis pulses palpable bilaterally. Posterior tibial pulses palpable bilaterally. Capillary refill normal to all digits.  No cyanosis or clubbing noted. Pedal hair growth normal.  Neurologic Normal speech. Oriented to person, place, and time. Epicritic sensation to light touch grossly present bilaterally.  Dermatologic Nails thickened onychodystrophy mycotic toenails x 2 bilateral hallux Skin within normal limits  Orthopedic: Normal joint ROM without pain or crepitus bilaterally. No visible deformities. No bony tenderness.   Radiographs: None Assessment:  No diagnosis found. Plan:  Patient was evaluated and treated and all questions answered.  Bilateral hallux onychomycosis -Educated the  patient on the etiology of onychomycosis and various treatment options associated with improving the fungal load.  I explained to the patient that there is 3 treatment options available to treat the onychomycosis including topical, p.o., laser treatment.  Patient elected to undergo p.o. options with Lamisil/terbinafine therapy.  In order for me to start the medication therapy, I explained to the patient the importance of evaluating the liver and obtaining the liver function test.  Once the liver function test comes back normal I will start him on 19-month course of Lamisil therapy.  Patient understood all risk and would like to proceed with Lamisil therapy.  I have asked the patient to immediately stop the Lamisil therapy if she has any reactions to it and call the office or go to the emergency room right away.  Patient states understanding   No follow-ups on file.

## 2023-11-08 ENCOUNTER — Other Ambulatory Visit: Payer: Self-pay | Admitting: Podiatry

## 2023-11-08 LAB — HEPATIC FUNCTION PANEL
ALT: 8 [IU]/L (ref 0–32)
AST: 22 [IU]/L (ref 0–40)
Albumin: 4.6 g/dL (ref 3.9–4.9)
Alkaline Phosphatase: 53 [IU]/L (ref 44–121)
Bilirubin Total: 0.8 mg/dL (ref 0.0–1.2)
Bilirubin, Direct: 0.33 mg/dL (ref 0.00–0.40)
Total Protein: 7.4 g/dL (ref 6.0–8.5)

## 2023-11-08 MED ORDER — TERBINAFINE HCL 250 MG PO TABS: 250.0000 mg | ORAL_TABLET | Freq: Every day | ORAL | 0 refills | Status: AC

## 2023-11-21 ENCOUNTER — Ambulatory Visit
Admission: RE | Admit: 2023-11-21 | Discharge: 2023-11-21 | Disposition: A | Discharge status: Home / Self Care | Payer: BC Managed Care – PPO | Source: Ambulatory Visit | Attending: Primary Care | Admitting: Primary Care

## 2023-11-21 DIAGNOSIS — Z1231 Encounter for screening mammogram for malignant neoplasm of breast: Secondary | ICD-10-CM | POA: Diagnosis not present

## 2024-01-30 ENCOUNTER — Ambulatory Visit: Payer: BC Managed Care – PPO
